# Patient Record
Sex: Female | Born: 1955 | Race: White | Hispanic: No | Smoking: Never smoker
Health system: Southern US, Community
[De-identification: ages and names within clinical notes are randomized; demographics above are authoritative.]

## PROBLEM LIST (undated history)

## (undated) DIAGNOSIS — E785 Hyperlipidemia, unspecified: Secondary | ICD-10-CM

## (undated) DIAGNOSIS — I10 Essential (primary) hypertension: Secondary | ICD-10-CM

## (undated) DIAGNOSIS — M1990 Unspecified osteoarthritis, unspecified site: Secondary | ICD-10-CM

## (undated) DIAGNOSIS — K219 Gastro-esophageal reflux disease without esophagitis: Secondary | ICD-10-CM

## (undated) DIAGNOSIS — J189 Pneumonia, unspecified organism: Secondary | ICD-10-CM

## (undated) HISTORY — PX: CHOLECYSTECTOMY: SHX55

## (undated) HISTORY — PX: APPENDECTOMY: SHX54

## (undated) HISTORY — PX: TUBAL LIGATION: SHX77

---

## 2000-04-23 ENCOUNTER — Encounter: Payer: Self-pay | Admitting: Obstetrics and Gynecology

## 2000-04-23 ENCOUNTER — Encounter: Admission: RE | Admit: 2000-04-23 | Discharge: 2000-04-23 | Payer: Self-pay | Admitting: Obstetrics and Gynecology

## 2003-06-22 ENCOUNTER — Ambulatory Visit (HOSPITAL_COMMUNITY): Admission: RE | Admit: 2003-06-22 | Discharge: 2003-06-22 | Payer: Self-pay | Admitting: Gastroenterology

## 2006-04-21 ENCOUNTER — Other Ambulatory Visit: Admission: RE | Admit: 2006-04-21 | Discharge: 2006-04-21 | Payer: Self-pay | Admitting: Family Medicine

## 2006-05-28 ENCOUNTER — Encounter: Admission: RE | Admit: 2006-05-28 | Discharge: 2006-05-28 | Payer: Self-pay | Admitting: Family Medicine

## 2007-03-04 ENCOUNTER — Encounter (INDEPENDENT_AMBULATORY_CARE_PROVIDER_SITE_OTHER): Payer: Self-pay | Admitting: Specialist

## 2007-03-04 ENCOUNTER — Observation Stay (HOSPITAL_COMMUNITY): Admission: EM | Admit: 2007-03-04 | Discharge: 2007-03-05 | Payer: Self-pay | Admitting: Emergency Medicine

## 2007-03-04 ENCOUNTER — Encounter: Admission: RE | Admit: 2007-03-04 | Discharge: 2007-03-04 | Payer: Self-pay | Admitting: *Deleted

## 2007-04-26 ENCOUNTER — Other Ambulatory Visit: Admission: RE | Admit: 2007-04-26 | Discharge: 2007-04-26 | Payer: Self-pay | Admitting: Family Medicine

## 2008-03-21 IMAGING — CT CT ABDOMEN W/ CM
2 of 5 series · 13 of 32 positions shown, 18 images · IV contrast (READICAT/WATER & [ID] OMNI 300)
Comparison: none

CLINICAL DATA: 50-year-old female right mid and lower abdominal pain for 1 day.  
ABDOMEN CT WITH CONTRAST:
TECHNIQUE: Multidetector CT imaging of the abdomen was performed following the standard protocol during bolus administration of intravenous contrast.
Contrast:  100 cc Omnipaque 300
TECHNIQUE: Multidetector CT imaging of the pelvis was performed following the standard protocol during bolus administration of intravenous contrast.

[Series 2: abdomen w/ · axial · 0.78mm/px · z∈[-400,-95]mm · 5 of 93 slices shown, 10 images]
[im 16/93  soft-tissue]
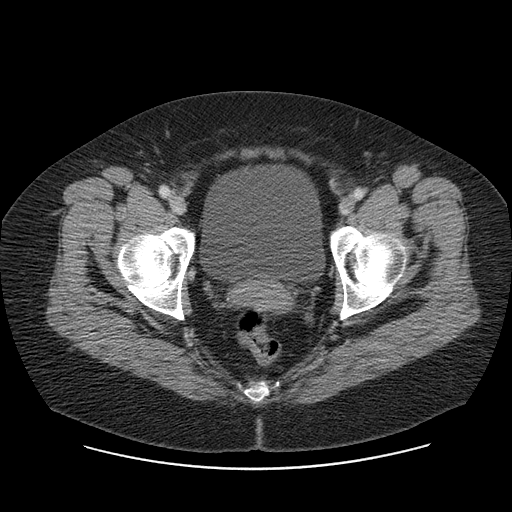
[im 16/93  bone]
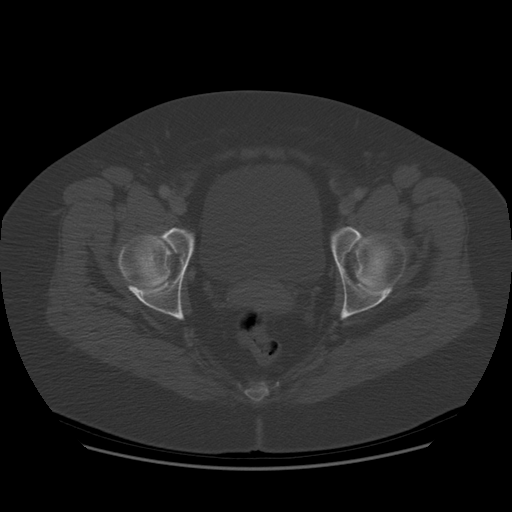
[im 31/93  soft-tissue]
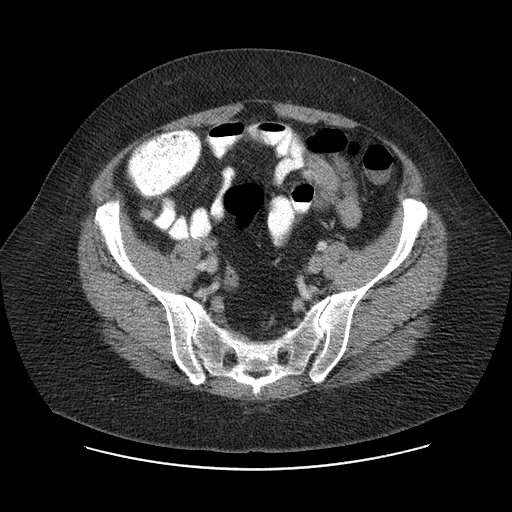
[im 31/93  lung]
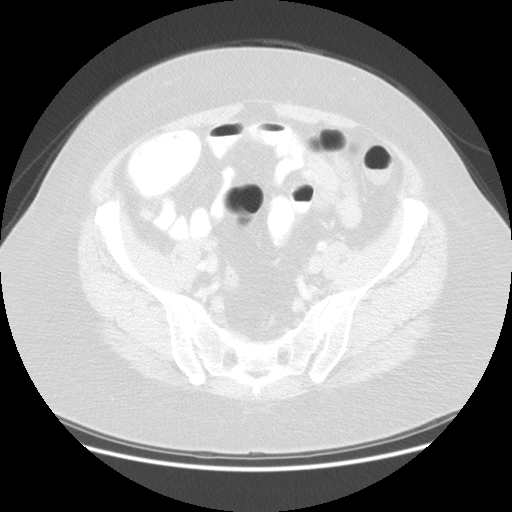
[im 47/93  soft-tissue]
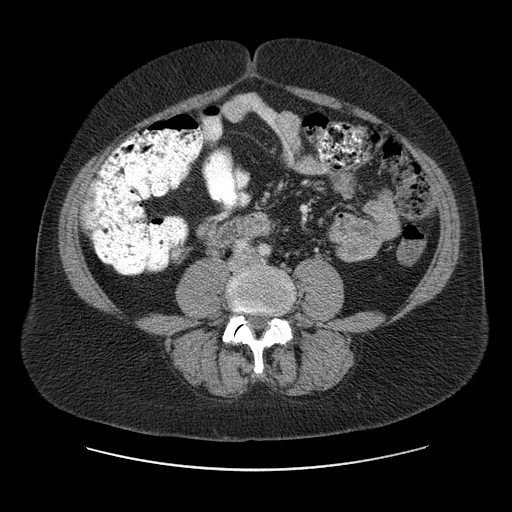
[im 47/93  lung]
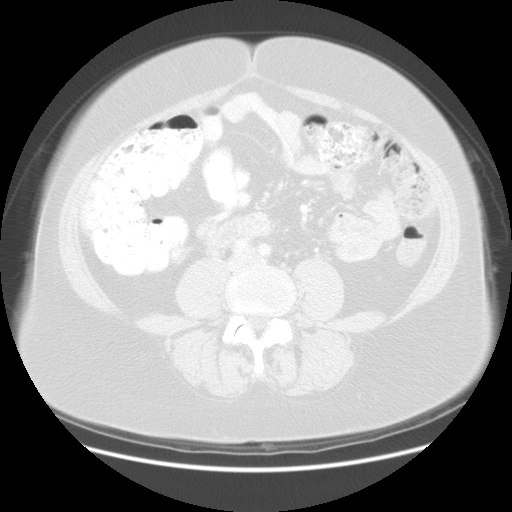
[im 62/93  soft-tissue]
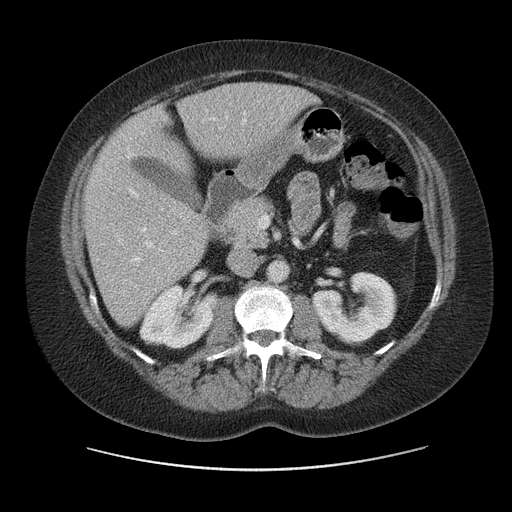
[im 62/93  lung]
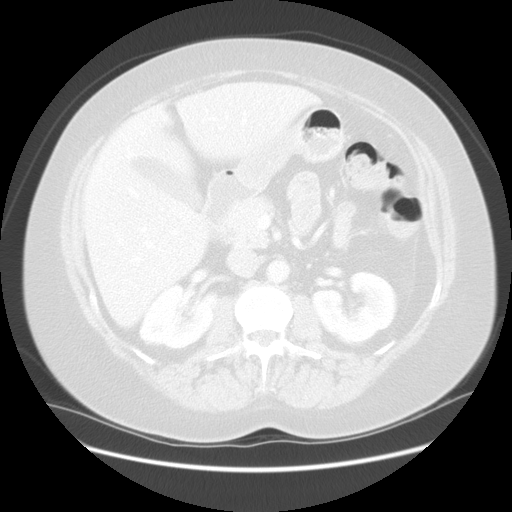
[im 77/93  soft-tissue]
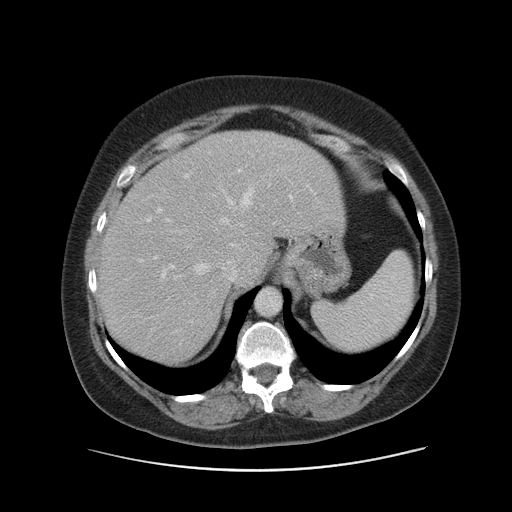
[im 77/93  lung]
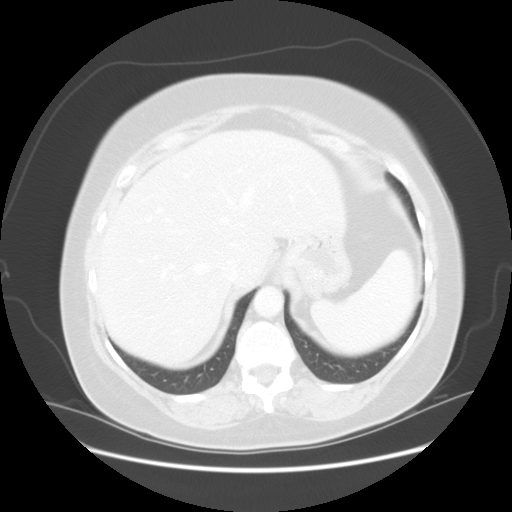

[Series 400: sagittal · sagittal · 0.92mm/px · 8 of 137 slices shown]
[im 14/137  soft-tissue]
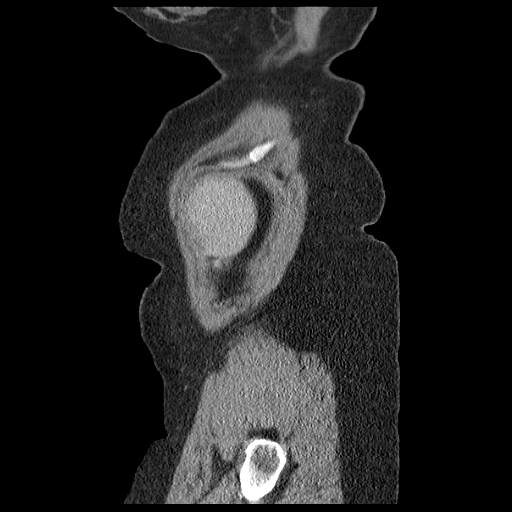
[im 28/137  soft-tissue]
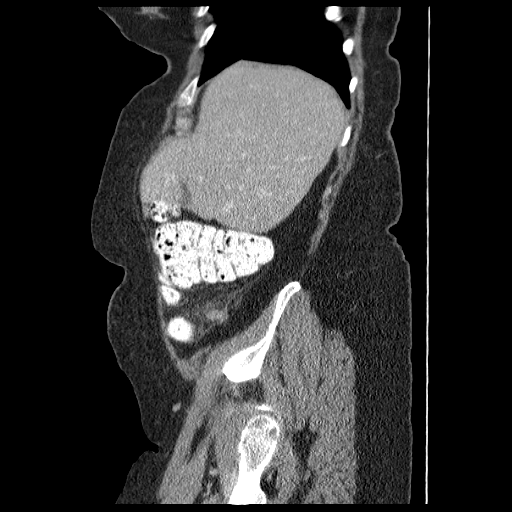
[im 41/137  soft-tissue]
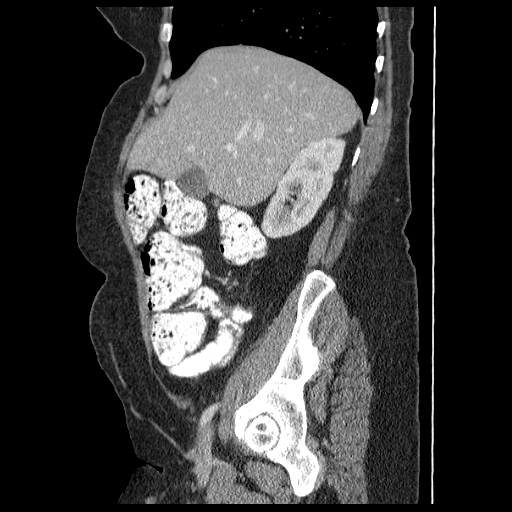
[im 55/137  soft-tissue]
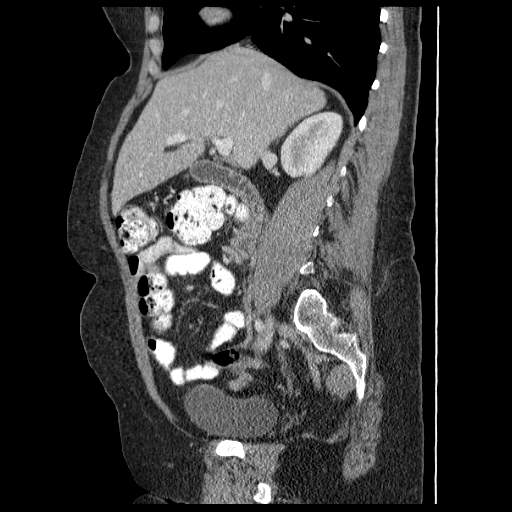
[im 82/137  soft-tissue]
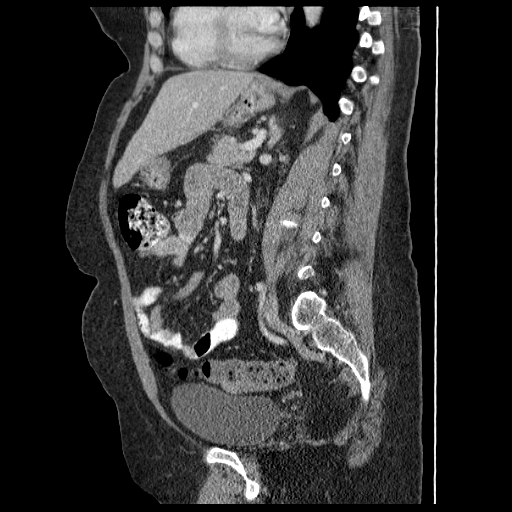
[im 96/137  soft-tissue]
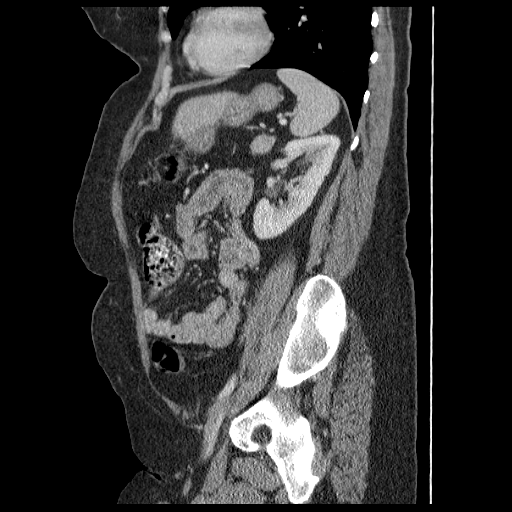
[im 109/137  soft-tissue]
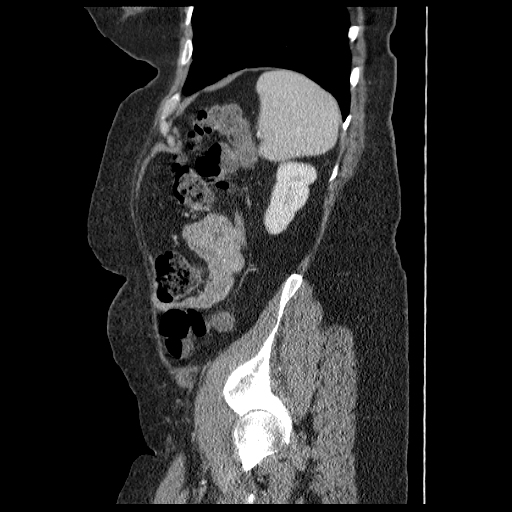
[im 123/137  soft-tissue]
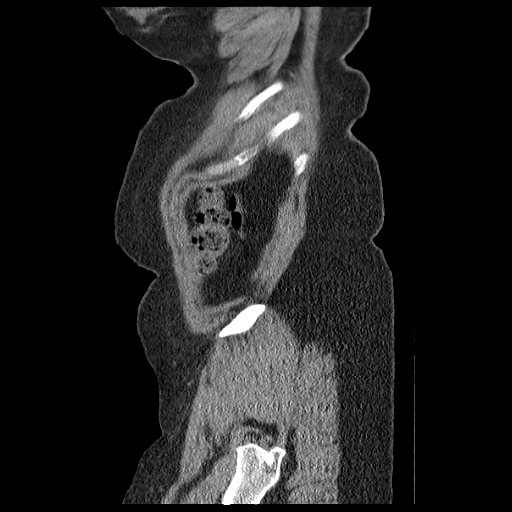

[13 of 32 positions shown; findings below may reference images not displayed]

FINDINGS: Lung bases are clear.  No pericardial or pleural effusion.  Normal heart size.  Degenerative changes of the thoracic spine. 
In the abdomen, the liver, gallbladder, biliary system, adrenal glands, kidneys, spleen, and the pancreas are normal.  No bowel obstruction, abdominal ascites, fluid collection, abscess, or free air.  In the right lower quadrant, there is haziness of the pericecal fat with mildly prominent lymph nodes.  More inferiorly the appendix has wall enhancement and has a diameter of 10 mm.  The findings are suspicious for early acute appendicitis.  No evidence of perforation, abscess, or bowel obstruction.
IMPRESSION: 1.  CT findings suspicious for early acute appendicitis in the right lower quadrant as described.
2.  No evidence of abscess or perforation.  No free air. 
PELVIS CT WITH CONTRAST:
FINDINGS: No pelvic adenopathy, free fluid, fluid collection, or acute bowel abnormality.  Urinary bladder is mildly distended.
IMPRESSION: No acute intrapelvic finding.  No significant free fluid.

## 2008-05-16 ENCOUNTER — Other Ambulatory Visit: Admission: RE | Admit: 2008-05-16 | Discharge: 2008-05-16 | Payer: Self-pay | Admitting: Family Medicine

## 2009-04-24 ENCOUNTER — Encounter: Admission: RE | Admit: 2009-04-24 | Discharge: 2009-04-24 | Payer: Self-pay | Admitting: Family Medicine

## 2010-04-29 ENCOUNTER — Other Ambulatory Visit: Admission: RE | Admit: 2010-04-29 | Discharge: 2010-04-29 | Payer: Self-pay | Admitting: Family Medicine

## 2011-05-15 NOTE — Op Note (Signed)
[name], [name]               ACCOUNT NO.:  [id number]   MEDICAL RECORD NO.:  [id number]          PATIENT TYPE:  INP   LOCATION:  0098                         FACILITY:  [facility]   PHYSICIAN:  Sandria Bales. Ezzard Standing, M.D.  DATE OF BIRTH:  [birth year]   DATE OF PROCEDURE:  DATE OF DISCHARGE:                               OPERATIVE REPORT   PREOPERATIVE DIAGNOSIS:  Appendicitis.   POSTOPERATIVE DIAGNOSIS:  Appendicitis.   PROCEDURE:  Laparoscopic appendectomy.   SURGEON:  Sandria Bales. Ezzard Standing, M.D.   ASSISTANT:  None.   ANESTHESIA:  General endotracheal.   ESTIMATED BLOOD LOSS:  Minimal.   INDICATIONS FOR PROCEDURE:  Ms. [name] is a 55 year old white female  patient of Dr. Sigmund Hazel who developed lower abdominal pain which  localized to the right lower quadrant.  She had a CT scan which showed  evidence of acute appendicitis and saw Dr. Selena Batten, who referred  her to me.  She came to the [facility] emergency room, where I  evaluated her.  I thought her signs and symptoms were consistent with  acute appendicitis.  I discussed with her and her husband about  proceeding with laparoscopic appendectomy.   I discussed the potential complications, to include bleeding, infection,  which I think she already has, bowel resection, and the possibly of open  surgery.   OPERATIVE NOTE:  Patient placed in a supine position.  She is already on  Cefoxitin as an IV antibiotic.  She had a Foley catheter in place.  Her  abdomen was prepped with Betadine solution and sterilely draped.  An  infraumbilical incision was made with sharp dissection and carried down  to the abdominal cavity.  A 12 mm Hasson trocar was secured with a 0  Vicryl suture.  A 10 mm 0 degree laparoscope was inserted through the  Hasson.  Laparoscopic exploration revealed right and left lobes of the  liver unremarkable.  The stomach was unremarkable.  Gynecologically, she  was unremarkable.  She did have 1 or 2 little fibroids  I could see.  Two  additional trocars were placed, a 5 mm [employer] trocar in the  right upper quadrant and a 10 mm [employer] in the left lower  quadrant.   The appendix was then identified lateral to the cecum.  I mobilized the  mesentery of the appendix using a harmonic scalpel.  I got down to the  base of the appendix, then I used an Ethicon 45 mm vascular load of the  Endo-GIA staple to staple across the base of the appendix.  The appendix  was then placed in an EndoCatch bag, delivered through the umbilicus,  and sent to pathology.  I then reinspected the appendiceal stump.  There  was no evidence of any bowel injury, and the staples looked intact.  I  irrigated the abdomen with 500 cc of saline.  There was minimal  contamination, so this was kind of an early appendicitis.   Each trocar was then removed in turn.  There was no bleeding at any  trocar site.  The umbilical port was  closed with a 2-0 Vicryl suture.  Each wound was closed with a 5-0 Monocryl suture, painted with Tincture  of Benzoin and Steri-Strips.  The patient tolerated the procedure well  and was transported to the recovery room in good condition.  Sponge and  needle count were correct at the end of the case.      Sandria Bales. Ezzard Standing, M.D.  Electronically Signed     DHN/MEDQ  D:  03/04/2007  T:  03/04/2007  Job:  [id number]   cc:   Sigmund Hazel, M.D.  Fax: [phone]   Pam Drown, M.D.  Fax: [phone]

## 2011-05-15 NOTE — Op Note (Signed)
   NAMEJILLANN, [name]                           ACCOUNT NO.:  [id number]   MEDICAL RECORD NO.:  [id number]                   PATIENT TYPE:  AMB   LOCATION:  ENDO                                 FACILITY:  [facility]   PHYSICIAN:  Anselmo Rod, M.D.               DATE OF BIRTH:  1956-09-01   DATE OF PROCEDURE:  06/22/2003  DATE OF DISCHARGE:                                 OPERATIVE REPORT   PROCEDURE:  Screening colonoscopy.   ENDOSCOPIST:  Anselmo Rod, M.D.   INSTRUMENT USED:  Olympus video colonoscope.   INDICATIONS FOR PROCEDURE:  Guaiac positive stools in a 55 year old white  female with a history of IBS, rule out colonic polyps, masses, hemorrhoids,  etc.   PREPROCEDURE PREPARATION:  Informed consent was procured from the patient.  The patient was fasted for eight hours prior to the procedure and prepped  with a bottle of magnesium citrate and a gallon of GoLYTELY the night prior  to the procedure.   PREPROCEDURE PHYSICAL EXAMINATION:  VITAL SIGNS: Stable.  NECK:  Supple.  CHEST:  Clear to auscultation. S1 and S2 regular.  ABDOMEN: Soft with normal bowel sounds.   DESCRIPTION OF PROCEDURE:  The patient was placed in the left lateral  decubitus position and sedated with an additional 30 mg of Demerol and 3 mg  of Versed intravenously. Once the patient was adequately sedated and  maintained on low flow oxygen and continuous cardiac monitoring, the Olympus  video colonoscope was advanced into the rectum to the cecum and terminal  ileum.  The appendiceal orifice was visualized and photographed. The patient  had discomfort with insufflation of air into the colon indicating  hypersensitivity.  The terminal ileum appeared healthy and without lesions.  No masses, polyps, erosions, ulcerations, or diverticula were seen.  Retroflexion in the rectum revealed no abnormalities.   IMPRESSION:  Normal colonoscopy up to the terminal ileum except for evidence  of visceral  hypersensitivity with insufflation of air into the colon.    RECOMMENDATIONS:  A high fiber diet with liberal fluid intake has been  advocated. 20 to 25 grams of fiber in the diet has been advised.  Repeat  guaiac testing will be done on an outpatient basis and further  recommendations will be made in the office in the next two weeks.                                               Anselmo Rod, M.D.    JNM/MEDQ  D:  06/22/2003  T:  06/23/2003  Job:  [id number]   cc:   Gabriel Earing, M.D.  [address]  [place]  [place] [address]  Fax: [phone]

## 2011-05-15 NOTE — Op Note (Signed)
   NAMEJOBY, Connie                           ACCOUNT NO.:  [id number]   MEDICAL RECORD NO.:  [id number]                   PATIENT TYPE:  AMB   LOCATION:  ENDO                                 FACILITY:  [facility]   PHYSICIAN:  Anselmo Rod, M.D.               DATE OF BIRTH:  December 14, 1956   DATE OF PROCEDURE:  06/22/2003  DATE OF DISCHARGE:                                 OPERATIVE REPORT   PROCEDURE:  Esophagogastroduodenoscopy.   ENDOSCOPIST:  Anselmo Rod, M.D.   INSTRUMENT USED:  Olympus video panendoscope.   INDICATIONS FOR PROCEDURE:  A 55 year old white female with guaiac positive  stools. Rule out peptic ulcer disease, esophagitis, gastritis, etc. The  patient has a history of reflux with a persistent cough.   PREPROCEDURE PREPARATION:  Informed consent was procured from the patient.  The patient was fasted for eight hours prior to the procedure.   PREPROCEDURE PHYSICAL EXAMINATION:  VITAL SIGNS: Stable.  NECK:  Supple.  CHEST:  Clear to auscultation. S1 and S2 regular.  ABDOMEN: Soft with normal bowel sounds.   DESCRIPTION OF PROCEDURE:  The patient was placed in the left lateral  decubitus position and sedated with 70 mg of Demerol and 7 mg of Versed  intravenously. Once the patient was adequately sedated and maintained on low  flow oxygen and continuous cardiac monitoring, the Olympus video  panendoscope was advanced through the mouthpiece, over the tongue, and into  the esophagus under direct vision. The entire esophagus appeared normal with  no evidence of ring, stricture, masses, esophagitis, or Barrett's mucosa.  The scope was then advanced into the stomach.  The entire gastric mucosa and  the proximal small bowel appeared normal. Retroflexion in the high cardia  revealed no abnormalities.   IMPRESSION:  Normal EGD.   RECOMMENDATIONS:  Proceed with a colonoscopy at this time.                                               Anselmo Rod, M.D.   JNM/MEDQ  D:  06/22/2003  T:  06/23/2003  Job:  [id number]   cc:   Gabriel Earing, M.D.  [address]  [place]  [place] [address]  Fax: [phone]
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
## 2016-01-22 ENCOUNTER — Other Ambulatory Visit: Payer: Self-pay | Admitting: Orthopaedic Surgery

## 2016-01-22 DIAGNOSIS — M25561 Pain in right knee: Secondary | ICD-10-CM

## 2016-01-27 ENCOUNTER — Ambulatory Visit
Admission: RE | Admit: 2016-01-27 | Discharge: 2016-01-27 | Disposition: A | Discharge status: Home / Self Care | Payer: BLUE CROSS/BLUE SHIELD | Source: Ambulatory Visit | Attending: Orthopaedic Surgery | Admitting: Orthopaedic Surgery

## 2016-01-27 DIAGNOSIS — M25561 Pain in right knee: Secondary | ICD-10-CM

## 2017-02-03 ENCOUNTER — Ambulatory Visit (INDEPENDENT_AMBULATORY_CARE_PROVIDER_SITE_OTHER): Payer: BLUE CROSS/BLUE SHIELD | Admitting: Physician Assistant

## 2017-02-03 DIAGNOSIS — M1711 Unilateral primary osteoarthritis, right knee: Secondary | ICD-10-CM | POA: Diagnosis not present

## 2017-02-03 MED ORDER — METHYLPREDNISOLONE ACETATE 40 MG/ML IJ SUSP
40.0000 mg | INTRAMUSCULAR | Status: AC | PRN
  Administered 2017-02-03: 40 mg via INTRA_ARTICULAR

## 2017-02-03 MED ORDER — LIDOCAINE HCL 1 % IJ SOLN
1.0000 mL | INTRAMUSCULAR | Status: AC | PRN
  Administered 2017-02-03: 1 mL

## 2017-02-03 NOTE — Progress Notes (Signed)
   Office Visit Note   Patient: [name]           Date of Birth: 18-Nov-1956           MRN: [id number] Visit Date: 02/03/2017              Requested by: No referring provider defined for this encounter. PCP: No primary care provider on file.   Assessment & Plan: Visit Diagnoses:  1. Primary osteoarthritis of right knee     Plan: She'll work on Dance movement psychotherapistquad strengthening. Follow with us on a when necessary basis.  Follow-Up Instructions: Return if symptoms worsen or fail to improve.   Orders:  Orders Placed This Encounter  Procedures  . Large Joint Injection/Arthrocentesis   No orders of the defined types were placed in this encounter.     Procedures: Large Joint Inj Date/Time: 02/03/2017 1:27 PM Performed by: Kirtland BouchardLARK, Tawny Raspberry W Authorized by: Kirtland BouchardLARK, Derrich Gaby W   Consent Given by:  Patient Indications:  Pain Location:  Knee Site:  R knee Needle Size:  22 G Approach:  Anterolateral Ultrasound Guidance: No   Fluoroscopic Guidance: No   Medications:  1 mL lidocaine 1 %; 40 mg methylPREDNISolone acetate 40 MG/ML Aspiration Attempted: No   Patient tolerance:  Patient tolerated the procedure well with no immediate complications     Clinical Data: No additional findings.   Subjective: No chief complaint on file.   HPI [name] comes in today requesting a right knee injection. Last injection was 05/06/2016. She had no known injury. She did undergo right knee arthroscopy on 03/05/2016 with a partial medial meniscectomy and debridement of medial compartmental R Wright us which was full-thickness.. Review of Systems   Objective: Vital Signs: There were no vitals taken for this visit.  Physical Exam  Constitutional: She is oriented to person, place, and time. She appears well-developed and well-nourished.  Pulmonary/Chest: Effort normal.  Neurological: She is alert and oriented to person, place, and time.    Ortho Exam Range of motion of the right knee. No effusion  no abnormal warmth no erythema. Specialty Comments:  No specialty comments available.  Imaging: No results found.   PMFS History: There are no active problems to display for this patient.  No past medical history on file.  No family history on file.  No past surgical history on file. Social History   Occupational History  . Not on file.   Social History Main Topics  . Smoking status: Not on file  . Smokeless tobacco: Not on file  . Alcohol use Not on file  . Drug use: Unknown  . Sexual activity: Not on file

## 2017-02-25 HISTORY — PX: KNEE ARTHROSCOPY: SUR90

## 2017-05-05 ENCOUNTER — Telehealth (INDEPENDENT_AMBULATORY_CARE_PROVIDER_SITE_OTHER): Payer: Self-pay

## 2017-05-05 NOTE — Telephone Encounter (Signed)
OK to schedule, but she does need an office visit prior so we can re-assess her knee and possibly get new pre-op x-rays for planning.

## 2017-05-05 NOTE — Telephone Encounter (Signed)
Patient left voice mail about scheduling uni knee.  Okay to schedule?  Does she need ROV?  [phone] or [phone]

## 2017-05-19 ENCOUNTER — Ambulatory Visit (INDEPENDENT_AMBULATORY_CARE_PROVIDER_SITE_OTHER): Payer: BLUE CROSS/BLUE SHIELD | Admitting: Orthopaedic Surgery

## 2017-05-19 ENCOUNTER — Encounter (INDEPENDENT_AMBULATORY_CARE_PROVIDER_SITE_OTHER): Payer: Self-pay

## 2017-05-19 ENCOUNTER — Ambulatory Visit (INDEPENDENT_AMBULATORY_CARE_PROVIDER_SITE_OTHER): Payer: Self-pay

## 2017-05-19 DIAGNOSIS — M1711 Unilateral primary osteoarthritis, right knee: Secondary | ICD-10-CM | POA: Diagnosis not present

## 2017-05-19 DIAGNOSIS — G8929 Other chronic pain: Secondary | ICD-10-CM

## 2017-05-19 DIAGNOSIS — M25561 Pain in right knee: Secondary | ICD-10-CM

## 2017-05-19 MED ORDER — LIDOCAINE HCL 1 % IJ SOLN
3.0000 mL | INTRAMUSCULAR | Status: AC | PRN
  Administered 2017-05-19: 3 mL

## 2017-05-19 MED ORDER — METHYLPREDNISOLONE ACETATE 40 MG/ML IJ SUSP
40.0000 mg | INTRAMUSCULAR | Status: AC | PRN
  Administered 2017-05-19: 40 mg via INTRA_ARTICULAR

## 2017-05-19 NOTE — Progress Notes (Signed)
Office Visit Note   Patient: [name]           Date of Birth: 1956/04/04           MRN: [id number] Visit Date: 05/19/2017              Requested by: No referring provider defined for this encounter. PCP: Patient, No Pcp Per   Assessment & Plan: Visit Diagnoses:  1. Chronic pain of right knee   2. Unilateral primary osteoarthritis, right knee     Plan: I was able to drain 50 mL of fluid from the knee and that she tolerated the steroid injection well. I do feel that she is a candidate for partial knee replacement. I showed her knee model with a long and thorough discussion about knee replaced surgery as well as a escutcheon the risk and benefits of this as well as her intraoperative and postoperative course. Work on getting her set up for the surgery. We then see her back in 2 weeks postoperative but no x-rays of be needed.  Follow-Up Instructions: Return for 2 weeks post-op.   Orders:  Orders Placed This Encounter  Procedures  . Large Joint Injection/Arthrocentesis  . XR Knee 1-2 Views Right   No orders of the defined types were placed in this encounter.     Procedures: Large Joint Inj Date/Time: 05/19/2017 2:36 PM Performed by: Kathryne Hitch Authorized by: Kathryne Hitch   Location:  Knee Site:  R knee Ultrasound Guidance: No   Fluoroscopic Guidance: No   Arthrogram: No   Medications:  3 mL lidocaine 1 %; 40 mg methylPREDNISolone acetate 40 MG/ML     Clinical Data: No additional findings.   Subjective: No chief complaint on file. The patient is well-known to me. She has severe medial compartment arthritis of her right knee. We performed an arthroscopic intervention just over a year ago the right knee and found complete loss of cartilage on the medial compartment. He still plexor daily terms of pain. Is now detrimentally affected her activities daily living, her quality of life, and her mobility. She has a large knee effusion that she  like a strain at a place a steroid. We are going to set her up soon for partial knee replacement. She is hoping to get this done soon due to the fact that her husband unfortunately is battling severe pancreatic cancer that is in late stages. She is tried and failed all forms conservative treatment at this point.  HPI  Review of Systems She currently denies any headache, chest pain, shortness of breath, fever, chills, nausea, vomiting.  Objective: Vital Signs: There were no vitals taken for this visit.  Physical Exam He is alert and oriented 3 and in no acute distress Ortho Exam Examine shoulder right knee shows a large effusion. She has a varus malalignment is easily correctable. Her knee is ligamentously stable. There is some mild patellofemoral crepitation. Specialty Comments:  No specialty comments available.  Imaging: Xr Knee 1-2 Views Right  Result Date: 05/19/2017 An AP and lateral of the right knee show mild varus malalignment with severe arthritis of the medial compartment.    PMFS History: Patient Active Problem List   Diagnosis Date Noted  . Unilateral primary osteoarthritis, right knee 05/19/2017  . Chronic pain of right knee 05/19/2017   No past medical history on file.  No family history on file.  No past surgical history on file. Social History   Occupational History  . Not  on file.   Social History Main Topics  . Smoking status: Not on file  . Smokeless tobacco: Not on file  . Alcohol use Not on file  . Drug use: Unknown  . Sexual activity: Not on file

## 2017-06-18 ENCOUNTER — Other Ambulatory Visit (INDEPENDENT_AMBULATORY_CARE_PROVIDER_SITE_OTHER): Payer: Self-pay | Admitting: Physician Assistant

## 2017-06-23 ENCOUNTER — Other Ambulatory Visit (HOSPITAL_COMMUNITY): Payer: Self-pay | Admitting: Emergency Medicine

## 2017-06-23 NOTE — Patient Instructions (Signed)
Connie BreedMargie Walker  06/23/2017   Your procedure is scheduled on: 07-02-17  [facility]  Entrance [place]  elevators to 3rd floor to  Short Stay Center at [phone].   Call this number if you have problems the morning of surgery [phone]    Remember: ONLY 1 PERSON MAY GO WITH YOU TO SHORT STAY TO GET  READY MORNING OF YOUR SURGERY.  Do not eat food or drink liquids :After Midnight.     Take these medicines the morning of surgery with A SIP OF WATER: ranitidine(zantac)                                 You may not have any metal on your body including hair pins and              piercings  Do not wear jewelry, make-up, lotions, powders or perfumes, deodorant             Do not wear nail polish.  Do not shave  48 hours prior to surgery.               Do not bring valuables to the hospital. [place] IS NOT             RESPONSIBLE   FOR VALUABLES.  Contacts, dentures or bridgework may not be worn into surgery.  Leave suitcase in the car. After surgery it may be brought to your room.               Please read over the following fact sheets you were given: _____________________________________________________________________        [facility] Pre[facility] Before surgery, you can play an important role.  Because skin is not sterile, your skin needs to be as free of germs as possible.  You can reduce the number of germs on your skin by washing with CHG (chlorahexidine gluconate) soap before surgery.  CHG is an antiseptic cleaner which kills germs and bonds with the skin to continue killing germs even after washing. Please DO NOT use if you have an allergy to CHG or antibacterial soaps.  If your skin becomes reddened/irritated stop using the CHG and inform your nurse when you arrive at Short Stay. Do not shave (including legs and underarms) for at least 48 hours prior to the first CHG shower.  You may shave your face/neck. Please follow these  instructions carefully:  1.  Shower with CHG Soap the night before surgery and the  morning of Surgery.  2.  If you choose to wash your hair, wash your hair first as usual with your  normal  shampoo.  3.  After you shampoo, rinse your hair and body thoroughly to remove the  shampoo.                           4.  Use CHG as you would any other liquid soap.  You can apply chg directly  to the skin and wash                       Gently with a scrungie or clean washcloth.  5.  Apply the CHG Soap to your body ONLY FROM THE NECK DOWN.   Do not use on  face/ open                           Wound or open sores. Avoid contact with eyes, ears mouth and genitals (private parts).                       Wash face,  Genitals (private parts) with your normal soap.             6.  Wash thoroughly, paying special attention to the area where your surgery  will be performed.  7.  Thoroughly rinse your body with warm water from the neck down.  8.  DO NOT shower/wash with your normal soap after using and rinsing off  the CHG Soap.                9.  Pat yourself dry with a clean towel.            10.  Wear clean pajamas.            11.  Place clean sheets on your bed the night of your first shower and do not  sleep with pets. Day of Surgery : Do not apply any lotions/deodorants the morning of surgery.  Please wear clean clothes to the hospital/surgery center.  FAILURE TO FOLLOW THESE INSTRUCTIONS MAY RESULT IN THE CANCELLATION OF YOUR SURGERY PATIENT SIGNATURE_________________________________  NURSE SIGNATURE__________________________________  ________________________________________________________________________   Adam Phenix  An incentive spirometer is a tool that can help keep your lungs clear and active. This tool measures how well you are filling your lungs with each breath. Taking long deep breaths may help reverse or decrease the chance of developing breathing (pulmonary) problems (especially  infection) following:  A long period of time when you are unable to move or be active. BEFORE THE PROCEDURE   If the spirometer includes an indicator to show your best effort, your nurse or respiratory therapist will set it to a desired goal.  If possible, sit up straight or lean slightly forward. Try not to slouch.  Hold the incentive spirometer in an upright position. INSTRUCTIONS FOR USE  1. Sit on the edge of your bed if possible, or sit up as far as you can in bed or on a chair. 2. Hold the incentive spirometer in an upright position. 3. Breathe out normally. 4. Place the mouthpiece in your mouth and seal your lips tightly around it. 5. Breathe in slowly and as deeply as possible, raising the piston or the ball toward the top of the column. 6. Hold your breath for 3-5 seconds or for as long as possible. Allow the piston or ball to fall to the bottom of the column. 7. Remove the mouthpiece from your mouth and breathe out normally. 8. Rest for a few seconds and repeat Steps 1 through 7 at least 10 times every 1-2 hours when you are awake. Take your time and take a few normal breaths between deep breaths. 9. The spirometer may include an indicator to show your best effort. Use the indicator as a goal to work toward during each repetition. 10. After each set of 10 deep breaths, practice coughing to be sure your lungs are clear. If you have an incision (the cut made at the time of surgery), support your incision when coughing by placing a pillow or rolled up towels firmly against it. Once you are able to get out of bed, walk around indoors  and cough well. You may stop using the incentive spirometer when instructed by your caregiver.  RISKS AND COMPLICATIONS  Take your time so you do not get dizzy or light-headed.  If you are in pain, you may need to take or ask for pain medication before doing incentive spirometry. It is harder to take a deep breath if you are having pain. AFTER  USE  Rest and breathe slowly and easily.  It can be helpful to keep track of a log of your progress. Your caregiver can provide you with a simple table to help with this. If you are using the spirometer at home, follow these instructions: SEEK MEDICAL CARE IF:   You are having difficultly using the spirometer.  You have trouble using the spirometer as often as instructed.  Your pain medication is not giving enough relief while using the spirometer.  You develop fever of 100.5 F (38.1 C) or higher. SEEK IMMEDIATE MEDICAL CARE IF:   You cough up bloody sputum that had not been present before.  You develop fever of 102 F (38.9 C) or greater.  You develop worsening pain at or near the incision site. MAKE SURE YOU:   Understand these instructions.  Will watch your condition.  Will get help right away if you are not doing well or get worse. Document Released: 04/26/2007 Document Revised: 03/07/2012 Document Reviewed: 06/27/2007 [facility] Patient Information 2014 [place], [place].   ________________________________________________________________________

## 2017-06-25 ENCOUNTER — Encounter (HOSPITAL_COMMUNITY)
Admission: RE | Admit: 2017-06-25 | Discharge: 2017-06-25 | Disposition: A | Discharge status: Home / Self Care | Payer: BLUE CROSS/BLUE SHIELD | Source: Ambulatory Visit | Attending: Orthopaedic Surgery | Admitting: Orthopaedic Surgery

## 2017-06-25 ENCOUNTER — Encounter (HOSPITAL_COMMUNITY): Payer: Self-pay

## 2017-06-25 ENCOUNTER — Encounter (INDEPENDENT_AMBULATORY_CARE_PROVIDER_SITE_OTHER): Payer: Self-pay

## 2017-06-25 DIAGNOSIS — M1711 Unilateral primary osteoarthritis, right knee: Secondary | ICD-10-CM | POA: Insufficient documentation

## 2017-06-25 DIAGNOSIS — Z01810 Encounter for preprocedural cardiovascular examination: Secondary | ICD-10-CM | POA: Insufficient documentation

## 2017-06-25 DIAGNOSIS — Z01818 Encounter for other preprocedural examination: Secondary | ICD-10-CM | POA: Diagnosis present

## 2017-06-25 DIAGNOSIS — I10 Essential (primary) hypertension: Secondary | ICD-10-CM | POA: Insufficient documentation

## 2017-06-25 HISTORY — DX: Pneumonia, unspecified organism: J18.9

## 2017-06-25 HISTORY — DX: Unspecified osteoarthritis, unspecified site: M19.90

## 2017-06-25 HISTORY — DX: Essential (primary) hypertension: I10

## 2017-06-25 HISTORY — DX: Hyperlipidemia, unspecified: E78.5

## 2017-06-25 HISTORY — DX: Gastro-esophageal reflux disease without esophagitis: K21.9

## 2017-06-25 LAB — CBC
HCT: 40.4 % (ref 36.0–46.0)
Hemoglobin: 13.8 g/dL (ref 12.0–15.0)
MCH: 31.4 pg (ref 26.0–34.0)
MCHC: 34.2 g/dL (ref 30.0–36.0)
MCV: 92 fL (ref 78.0–100.0)
PLATELETS: 286 10*3/uL (ref 150–400)
RBC: 4.39 MIL/uL (ref 3.87–5.11)
RDW: 12.2 % (ref 11.5–15.5)
WBC: 6.3 10*3/uL (ref 4.0–10.5)

## 2017-06-25 LAB — SURGICAL PCR SCREEN
MRSA, PCR: NEGATIVE
STAPHYLOCOCCUS AUREUS: NEGATIVE

## 2017-06-25 LAB — BASIC METABOLIC PANEL
Anion gap: 9 (ref 5–15)
BUN: 9 mg/dL (ref 6–20)
CALCIUM: 9.7 mg/dL (ref 8.9–10.3)
CO2: 27 mmol/L (ref 22–32)
CREATININE: 0.69 mg/dL (ref 0.44–1.00)
Chloride: 106 mmol/L (ref 101–111)
GFR calc Af Amer: >60 mL/min (ref >60)
GLUCOSE: 98 mg/dL (ref 65–99)
Potassium: 4.7 mmol/L (ref 3.5–5.1)
Sodium: 142 mmol/L (ref 135–145)

## 2017-06-25 NOTE — Progress Notes (Deleted)
   06/25/17 0928  OBSTRUCTIVE SLEEP APNEA  Have you ever been diagnosed with sleep apnea through a sleep study? No  Do you snore loudly (loud enough to be heard through closed doors)?  1  Do you often feel tired, fatigued, or sleepy during the daytime (such as falling asleep during driving or talking to someone)? 1  Has anyone observed you stop breathing during your sleep? 0  Do you have, or are you being treated for high blood pressure? 1  BMI more than 35 kg/m2? 1  Age > 50 (1-yes) 1  Neck circumference greater than:Female 16 inches or larger, Female 17inches or larger? 0  Female Gender (Yes=1) 0  Obstructive Sleep Apnea Score 5

## 2017-07-01 NOTE — H&P (Signed)
TOTAL KNEE ADMISSION H&P  Patient is being admitted for right partial knee arthroplasty.  Subjective:  Chief Complaint:right knee pain.  HPI: [name], 61 y.o. female, has a history of pain and functional disability in the right knee due to arthritis and has failed non-surgical conservative treatments for greater than 12 weeks to includeNSAID's and/or analgesics, corticosteriod injections, viscosupplementation injections, flexibility and strengthening excercises, use of assistive devices, weight reduction as appropriate and activity modification.  Onset of symptoms was gradual, starting 3 years ago with gradually worsening course since that time. The patient noted prior procedures on the knee to include  arthroscopy on the right knee(s).  Patient currently rates pain in the right knee(s) at 10 out of 10 with activity. Patient has night pain, worsening of pain with activity and weight bearing, pain that interferes with activities of daily living, pain with passive range of motion, crepitus and joint swelling.  Patient has evidence of subchondral sclerosis, periarticular osteophytes and joint space narrowing by imaging studies. There is no active infection.  Patient Active Problem List   Diagnosis Date Noted  . Unilateral primary osteoarthritis, right knee 05/19/2017  . Chronic pain of right knee 05/19/2017   Past Medical History:  Diagnosis Date  . Arthritis   . GERD (gastroesophageal reflux disease)   . HLD (hyperlipidemia)   . Hypertension   . Pneumonia    4 years ago     Past Surgical History:  Procedure Laterality Date  . APPENDECTOMY     about 10 years ago   . CHOLECYSTECTOMY     4 years ago   . KNEE ARTHROSCOPY Right 02/2017  . TUBAL LIGATION     38 years ago     No prescriptions prior to admission.   Allergies  Allergen Reactions  . Statins     Short term memory loss    Social History  Substance Use Topics  . Smoking status: Never Smoker  . Smokeless tobacco:  Never Used  . Alcohol use 3.0 oz/week    5 Glasses of wine per week    No family history on file.   Review of Systems  Musculoskeletal: Positive for joint pain.  All other systems reviewed and are negative.   Objective:  Physical Exam  Constitutional: She is oriented to person, place, and time. She appears well-developed and well-nourished.  HENT:  Head: Normocephalic and atraumatic.  Eyes: EOM are normal. Pupils are equal, round, and reactive to light.  Neck: Normal range of motion. Neck supple.  Cardiovascular: Normal rate.   Respiratory: Effort normal and breath sounds normal.  GI: Soft. Bowel sounds are normal.  Musculoskeletal:       Right knee: She exhibits effusion and abnormal alignment. Tenderness found. Medial joint line tenderness noted.  Neurological: She is alert and oriented to person, place, and time.  Skin: Skin is warm and dry.  Psychiatric: She has a normal mood and affect.    Vital signs in last 24 hours:    Labs:   Estimated body mass index is 35.68 kg/m as calculated from the following:   Height as of 06/25/17: 5\' 5"  (1.651 m).   Weight as of 06/25/17: 214 lb 6.4 oz (97.3 kg).   Imaging Review Plain radiographs demonstrate severe degenerative joint disease of the right knee medial compartment. The overall alignment ismild varus. The bone quality appears to be good for age and reported activity level.  Assessment/Plan:  End stage arthritis, right knee medial compartment  The patient history, physical  examination, clinical judgment of the provider and imaging studies are consistent with end stage degenerative joint disease of the right knee(s) and partial knee arthroplasty is deemed medically necessary. The treatment options including medical management, injection therapy arthroscopy and arthroplasty were discussed at length. The risks and benefits of partial knee arthroplasty were presented and reviewed. The risks due to aseptic loosening, infection,  stiffness, patella tracking problems, thromboembolic complications and other imponderables were discussed. The patient acknowledged the explanation, agreed to proceed with the plan and consent was signed. Patient is being admitted for inpatient treatment for surgery, pain control, PT, OT, prophylactic antibiotics, VTE prophylaxis, progressive ambulation and ADL's and discharge planning. The patient is planning to be discharged home with home health services

## 2017-07-02 ENCOUNTER — Encounter (HOSPITAL_COMMUNITY)
Admission: RE | Disposition: A | Discharge status: Home / Self Care | Payer: Self-pay | Source: Ambulatory Visit | Attending: Orthopaedic Surgery

## 2017-07-02 ENCOUNTER — Ambulatory Visit (HOSPITAL_COMMUNITY): Payer: BLUE CROSS/BLUE SHIELD | Admitting: Registered Nurse

## 2017-07-02 ENCOUNTER — Observation Stay (HOSPITAL_COMMUNITY)
Admission: RE | Admit: 2017-07-02 | Discharge: 2017-07-03 | Disposition: A | Discharge status: Home / Self Care | Payer: BLUE CROSS/BLUE SHIELD | Source: Ambulatory Visit | Attending: Orthopaedic Surgery | Admitting: Orthopaedic Surgery

## 2017-07-02 ENCOUNTER — Observation Stay (HOSPITAL_COMMUNITY): Payer: BLUE CROSS/BLUE SHIELD

## 2017-07-02 ENCOUNTER — Encounter (HOSPITAL_COMMUNITY): Payer: Self-pay

## 2017-07-02 DIAGNOSIS — Z7982 Long term (current) use of aspirin: Secondary | ICD-10-CM | POA: Diagnosis not present

## 2017-07-02 DIAGNOSIS — I10 Essential (primary) hypertension: Secondary | ICD-10-CM | POA: Diagnosis not present

## 2017-07-02 DIAGNOSIS — M1711 Unilateral primary osteoarthritis, right knee: Principal | ICD-10-CM | POA: Insufficient documentation

## 2017-07-02 DIAGNOSIS — K219 Gastro-esophageal reflux disease without esophagitis: Secondary | ICD-10-CM | POA: Diagnosis not present

## 2017-07-02 DIAGNOSIS — E785 Hyperlipidemia, unspecified: Secondary | ICD-10-CM | POA: Diagnosis not present

## 2017-07-02 DIAGNOSIS — Z96651 Presence of right artificial knee joint: Secondary | ICD-10-CM

## 2017-07-02 HISTORY — PX: PARTIAL KNEE ARTHROPLASTY: SHX2174

## 2017-07-02 SURGERY — ARTHROPLASTY, KNEE, UNICOMPARTMENTAL
Anesthesia: Spinal | Site: Knee | Laterality: Right

## 2017-07-02 MED ORDER — LOSARTAN POTASSIUM 50 MG PO TABS
50.0000 mg | ORAL_TABLET | Freq: Every day | ORAL | Status: DC
  Administered 2017-07-02: 13:00:00 50 mg via ORAL
  Filled 2017-07-02 (×2): qty 1

## 2017-07-02 MED ORDER — ACETAMINOPHEN 325 MG PO TABS
650.0000 mg | ORAL_TABLET | Freq: Four times a day (QID) | ORAL | Status: DC | PRN
  Administered 2017-07-02 – 2017-07-03 (×2): 650 mg via ORAL
  Filled 2017-07-02 (×2): qty 2

## 2017-07-02 MED ORDER — CEFAZOLIN SODIUM-DEXTROSE 1-4 GM/50ML-% IV SOLN
1.0000 g | Freq: Four times a day (QID) | INTRAVENOUS | Status: AC
  Administered 2017-07-02 (×2): 1 g via INTRAVENOUS
  Filled 2017-07-02 (×2): qty 50

## 2017-07-02 MED ORDER — ASPIRIN EC 325 MG PO TBEC
325.0000 mg | DELAYED_RELEASE_TABLET | Freq: Two times a day (BID) | ORAL | Status: DC
  Administered 2017-07-03: 325 mg via ORAL
  Filled 2017-07-02: qty 1

## 2017-07-02 MED ORDER — HYDROCHLOROTHIAZIDE 12.5 MG PO CAPS
12.5000 mg | ORAL_CAPSULE | Freq: Every day | ORAL | Status: DC
  Administered 2017-07-02: 13:00:00 12.5 mg via ORAL
  Filled 2017-07-02 (×2): qty 1

## 2017-07-02 MED ORDER — ACETAMINOPHEN 10 MG/ML IV SOLN
INTRAVENOUS | Status: AC
  Filled 2017-07-02: qty 100

## 2017-07-02 MED ORDER — PHENOL 1.4 % MT LIQD: 1.0000 | OROMUCOSAL | Status: DC | PRN

## 2017-07-02 MED ORDER — ONDANSETRON HCL 4 MG/2ML IJ SOLN
INTRAMUSCULAR | Status: AC
  Filled 2017-07-02: qty 2

## 2017-07-02 MED ORDER — PROPOFOL 10 MG/ML IV BOLUS
INTRAVENOUS | Status: AC
  Filled 2017-07-02: qty 20

## 2017-07-02 MED ORDER — ACETAMINOPHEN 10 MG/ML IV SOLN
1000.0000 mg | Freq: Once | INTRAVENOUS | Status: AC
  Administered 2017-07-02: 1000 mg via INTRAVENOUS

## 2017-07-02 MED ORDER — ONDANSETRON HCL 4 MG PO TABS: 4.0000 mg | ORAL_TABLET | Freq: Four times a day (QID) | ORAL | Status: DC | PRN

## 2017-07-02 MED ORDER — DIPHENHYDRAMINE HCL 12.5 MG/5ML PO ELIX: 12.5000 mg | ORAL_SOLUTION | ORAL | Status: DC | PRN

## 2017-07-02 MED ORDER — MIDAZOLAM HCL 2 MG/2ML IJ SOLN
INTRAMUSCULAR | Status: AC
  Filled 2017-07-02: qty 2

## 2017-07-02 MED ORDER — METHOCARBAMOL 500 MG PO TABS
500.0000 mg | ORAL_TABLET | Freq: Four times a day (QID) | ORAL | Status: DC | PRN
  Administered 2017-07-02 – 2017-07-03 (×3): 500 mg via ORAL
  Filled 2017-07-02 (×3): qty 1

## 2017-07-02 MED ORDER — ZOLPIDEM TARTRATE 5 MG PO TABS: 5.0000 mg | ORAL_TABLET | Freq: Every evening | ORAL | Status: DC | PRN

## 2017-07-02 MED ORDER — ALUM & MAG HYDROXIDE-SIMETH 200-200-20 MG/5ML PO SUSP: 30.0000 mL | ORAL | Status: DC | PRN

## 2017-07-02 MED ORDER — FAMOTIDINE 20 MG PO TABS
20.0000 mg | ORAL_TABLET | Freq: Every day | ORAL | Status: DC
  Administered 2017-07-02 – 2017-07-03 (×2): 20 mg via ORAL
  Filled 2017-07-02 (×2): qty 1

## 2017-07-02 MED ORDER — METOCLOPRAMIDE HCL 5 MG/ML IJ SOLN: 5.0000 mg | Freq: Three times a day (TID) | INTRAMUSCULAR | Status: DC | PRN

## 2017-07-02 MED ORDER — MENTHOL 3 MG MT LOZG: 1.0000 | LOZENGE | OROMUCOSAL | Status: DC | PRN

## 2017-07-02 MED ORDER — TRANEXAMIC ACID 1000 MG/10ML IV SOLN
1000.0000 mg | INTRAVENOUS | Status: AC
  Administered 2017-07-02: 1000 mg via INTRAVENOUS
  Filled 2017-07-02: qty 1100

## 2017-07-02 MED ORDER — DOCUSATE SODIUM 100 MG PO CAPS
100.0000 mg | ORAL_CAPSULE | Freq: Two times a day (BID) | ORAL | Status: DC
  Administered 2017-07-02 – 2017-07-03 (×2): 100 mg via ORAL
  Filled 2017-07-02 (×2): qty 1

## 2017-07-02 MED ORDER — PHENYLEPHRINE HCL 10 MG/ML IJ SOLN
INTRAMUSCULAR | Status: DC | PRN
  Administered 2017-07-02: 35 ug/min via INTRAVENOUS

## 2017-07-02 MED ORDER — LACTATED RINGERS IV SOLN
INTRAVENOUS | Status: DC
  Administered 2017-07-02: 1000 mL via INTRAVENOUS

## 2017-07-02 MED ORDER — PROMETHAZINE HCL 25 MG/ML IJ SOLN: 6.2500 mg | INTRAMUSCULAR | Status: DC | PRN

## 2017-07-02 MED ORDER — BUPIVACAINE IN DEXTROSE 0.75-8.25 % IT SOLN
INTRATHECAL | Status: DC | PRN
  Administered 2017-07-02: 2 mL via INTRATHECAL

## 2017-07-02 MED ORDER — LIP MEDEX EX OINT
TOPICAL_OINTMENT | CUTANEOUS | Status: AC
  Filled 2017-07-02: qty 7

## 2017-07-02 MED ORDER — ONDANSETRON HCL 4 MG/2ML IJ SOLN
INTRAMUSCULAR | Status: DC | PRN
  Administered 2017-07-02: 4 mg via INTRAVENOUS

## 2017-07-02 MED ORDER — ROPIVACAINE HCL 5 MG/ML IJ SOLN
INTRAMUSCULAR | Status: DC | PRN
  Administered 2017-07-02: 30 mL via PERINEURAL

## 2017-07-02 MED ORDER — METHOCARBAMOL 1000 MG/10ML IJ SOLN
500.0000 mg | Freq: Four times a day (QID) | INTRAVENOUS | Status: DC | PRN
  Administered 2017-07-02: 500 mg via INTRAVENOUS
  Filled 2017-07-02: qty 550

## 2017-07-02 MED ORDER — DEXAMETHASONE SODIUM PHOSPHATE 10 MG/ML IJ SOLN
INTRAMUSCULAR | Status: AC
  Filled 2017-07-02: qty 1

## 2017-07-02 MED ORDER — CEFAZOLIN SODIUM-DEXTROSE 2-4 GM/100ML-% IV SOLN
2.0000 g | INTRAVENOUS | Status: AC
  Administered 2017-07-02: 2 g via INTRAVENOUS
  Filled 2017-07-02: qty 100

## 2017-07-02 MED ORDER — ACETAMINOPHEN 650 MG RE SUPP: 650.0000 mg | Freq: Four times a day (QID) | RECTAL | Status: DC | PRN

## 2017-07-02 MED ORDER — DEXAMETHASONE SODIUM PHOSPHATE 10 MG/ML IJ SOLN
INTRAMUSCULAR | Status: DC | PRN
  Administered 2017-07-02: 10 mg via INTRAVENOUS

## 2017-07-02 MED ORDER — CHLORHEXIDINE GLUCONATE 4 % EX LIQD: 60.0000 mL | Freq: Once | CUTANEOUS | Status: DC

## 2017-07-02 MED ORDER — FENTANYL CITRATE (PF) 100 MCG/2ML IJ SOLN
INTRAMUSCULAR | Status: DC | PRN
  Administered 2017-07-02: 100 ug via INTRAVENOUS

## 2017-07-02 MED ORDER — MIDAZOLAM HCL 5 MG/5ML IJ SOLN
INTRAMUSCULAR | Status: DC | PRN
  Administered 2017-07-02: 2 mg via INTRAVENOUS
  Administered 2017-07-02 (×2): 1 mg via INTRAVENOUS

## 2017-07-02 MED ORDER — PROPOFOL 10 MG/ML IV BOLUS
INTRAVENOUS | Status: AC
  Filled 2017-07-02: qty 60

## 2017-07-02 MED ORDER — OXYCODONE HCL 5 MG PO TABS
5.0000 mg | ORAL_TABLET | ORAL | Status: DC | PRN
  Administered 2017-07-02: 5 mg via ORAL
  Administered 2017-07-02: 15:00:00 10 mg via ORAL
  Administered 2017-07-02: 12:00:00 5 mg via ORAL
  Administered 2017-07-02 – 2017-07-03 (×6): 10 mg via ORAL
  Filled 2017-07-02 (×2): qty 2
  Filled 2017-07-02: qty 1
  Filled 2017-07-02 (×4): qty 2
  Filled 2017-07-02: qty 1
  Filled 2017-07-02: qty 2

## 2017-07-02 MED ORDER — PROPOFOL 500 MG/50ML IV EMUL
INTRAVENOUS | Status: DC | PRN
  Administered 2017-07-02: 100 ug/kg/min via INTRAVENOUS

## 2017-07-02 MED ORDER — ONDANSETRON HCL 4 MG/2ML IJ SOLN: 4.0000 mg | Freq: Four times a day (QID) | INTRAMUSCULAR | Status: DC | PRN

## 2017-07-02 MED ORDER — LOSARTAN POTASSIUM-HCTZ 50-12.5 MG PO TABS: 1.0000 | ORAL_TABLET | Freq: Every day | ORAL | Status: DC

## 2017-07-02 MED ORDER — METOCLOPRAMIDE HCL 5 MG PO TABS: 5.0000 mg | ORAL_TABLET | Freq: Three times a day (TID) | ORAL | Status: DC | PRN

## 2017-07-02 MED ORDER — HYDROMORPHONE HCL-NACL 0.5-0.9 MG/ML-% IV SOSY: 1.0000 mg | PREFILLED_SYRINGE | INTRAVENOUS | Status: DC | PRN

## 2017-07-02 MED ORDER — SODIUM CHLORIDE 0.9 % IV SOLN
INTRAVENOUS | Status: DC
  Administered 2017-07-02: 12:00:00 75 mL/h via INTRAVENOUS
  Administered 2017-07-03: 01:00:00 via INTRAVENOUS

## 2017-07-02 MED ORDER — HYDROMORPHONE HCL-NACL 0.5-0.9 MG/ML-% IV SOSY: 0.2500 mg | PREFILLED_SYRINGE | INTRAVENOUS | Status: DC | PRN

## 2017-07-02 MED ORDER — PROPOFOL 10 MG/ML IV BOLUS
INTRAVENOUS | Status: DC | PRN
  Administered 2017-07-02: 20 mg via INTRAVENOUS

## 2017-07-02 MED ORDER — FENTANYL CITRATE (PF) 100 MCG/2ML IJ SOLN
INTRAMUSCULAR | Status: AC
  Filled 2017-07-02: qty 2

## 2017-07-02 MED ORDER — LACTATED RINGERS IV SOLN
INTRAVENOUS | Status: DC | PRN
  Administered 2017-07-02 (×3): via INTRAVENOUS

## 2017-07-02 SURGICAL SUPPLY — 45 items
APL SKNCLS STERI-STRIP NONHPOA (GAUZE/BANDAGES/DRESSINGS) ×1
BAG SPEC THK2 15X12 ZIP CLS (MISCELLANEOUS) ×1
BAG ZIPLOCK 12X15 (MISCELLANEOUS) ×2 IMPLANT
BANDAGE ACE 6X5 VEL STRL LF (GAUZE/BANDAGES/DRESSINGS) ×3 IMPLANT
BENZOIN TINCTURE PRP APPL 2/3 (GAUZE/BANDAGES/DRESSINGS) ×2 IMPLANT
BLADE SAW RECIPROCATING 77.5 (BLADE) ×3 IMPLANT
BLADE SAW SAG 13X.89X90 PERFRM (BLADE) ×3 IMPLANT
BLADE SAW STABLE CUT 109 (BLADE) ×2 IMPLANT
BONE CEMENT PALACOSE (Cement) ×3 IMPLANT
BOWL SMART MIX CTS (DISPOSABLE) ×3 IMPLANT
CAPT KNEE PARTIAL 2 ×2 IMPLANT
CEMENT BONE PALACOSE (Cement) IMPLANT
CLOSURE WOUND 1/2 X4 (GAUZE/BANDAGES/DRESSINGS) ×1
CLOTH BEACON ORANGE TIMEOUT ST (SAFETY) ×3 IMPLANT
COVER SURGICAL LIGHT HANDLE (MISCELLANEOUS) ×3 IMPLANT
CUFF TOURN SGL QUICK 34 (TOURNIQUET CUFF) ×3
CUFF TRNQT CYL 34X4X40X1 (TOURNIQUET CUFF) ×1 IMPLANT
DRAPE U-SHAPE 47X51 STRL (DRAPES) ×3 IMPLANT
DRSG AQUACEL AG ADV 3.5X10 (GAUZE/BANDAGES/DRESSINGS) IMPLANT
DRSG PAD ABDOMINAL 8X10 ST (GAUZE/BANDAGES/DRESSINGS) ×4 IMPLANT
DURAPREP 26ML APPLICATOR (WOUND CARE) ×3 IMPLANT
ELECT REM PT RETURN 15FT ADLT (MISCELLANEOUS) ×3 IMPLANT
GAUZE SPONGE 4X4 12PLY STRL (GAUZE/BANDAGES/DRESSINGS) ×3 IMPLANT
GAUZE XEROFORM 1X8 LF (GAUZE/BANDAGES/DRESSINGS) ×2 IMPLANT
GLOVE BIO SURGEON STRL SZ7.5 (GLOVE) ×3 IMPLANT
GLOVE BIOGEL PI IND STRL 8 (GLOVE) ×2 IMPLANT
GLOVE BIOGEL PI INDICATOR 8 (GLOVE) ×4
GLOVE ECLIPSE 8.0 STRL XLNG CF (GLOVE) ×3 IMPLANT
GOWN STRL REUS W/TWL XL LVL3 (GOWN DISPOSABLE) ×6 IMPLANT
HANDPIECE INTERPULSE COAX TIP (DISPOSABLE) ×3
LEGGING LITHOTOMY PAIR STRL (DRAPES) ×3 IMPLANT
MANIFOLD NEPTUNE II (INSTRUMENTS) ×3 IMPLANT
PACK TOTAL KNEE CUSTOM (KITS) ×3 IMPLANT
PADDING CAST COTTON 6X4 STRL (CAST SUPPLIES) ×3 IMPLANT
SET HNDPC FAN SPRY TIP SCT (DISPOSABLE) ×1 IMPLANT
STAPLER VISISTAT 35W (STAPLE) IMPLANT
STRIP CLOSURE SKIN 1/2X4 (GAUZE/BANDAGES/DRESSINGS) ×1 IMPLANT
SUT MNCRL AB 4-0 PS2 18 (SUTURE) IMPLANT
SUT VIC AB 0 CT1 36 (SUTURE) IMPLANT
SUT VIC AB 1 CT1 36 (SUTURE) ×3 IMPLANT
SUT VIC AB 2-0 CT1 27 (SUTURE) ×6
SUT VIC AB 2-0 CT1 TAPERPNT 27 (SUTURE) ×2 IMPLANT
TRAY FOLEY CATH 14FRSI W/METER (CATHETERS) ×2 IMPLANT
TRAY FOLEY W/METER SILVER 16FR (SET/KITS/TRAYS/PACK) ×1 IMPLANT
WRAP KNEE MAXI GEL POST OP (GAUZE/BANDAGES/DRESSINGS) ×3 IMPLANT

## 2017-07-02 NOTE — Anesthesia Postprocedure Evaluation (Signed)
Anesthesia Post Note  Patient: [name]  Procedure(s) Performed: Procedure(s) (LRB): RIGHT UNICOMPARTMENTAL KNEE ARTHROPLASTY (Right)     Patient location during evaluation: PACU Anesthesia Type: Spinal Level of consciousness: oriented and awake and alert Pain management: pain level controlled Vital Signs Assessment: post-procedure vital signs reviewed and stable Respiratory status: spontaneous breathing, respiratory function stable and patient connected to nasal cannula oxygen Cardiovascular status: blood pressure returned to baseline and stable Postop Assessment: no headache and no backache Anesthetic complications: no    Last Vitals:  Vitals:   07/02/17 1306 07/02/17 1411  BP: 132/66 137/74  Pulse: 72 72  Resp: 14 15  Temp: 36.7 C     Last Pain:  Vitals:   07/02/17 1435  TempSrc:   PainSc: 6                  Jaylan Duggar S

## 2017-07-02 NOTE — Anesthesia Procedure Notes (Signed)
Anesthesia Procedure Image    

## 2017-07-02 NOTE — Anesthesia Procedure Notes (Signed)
Anesthesia Regional Block: Adductor canal block   Pre-Anesthetic Checklist: ,, timeout performed, Correct Patient, Correct Site, Correct Laterality, Correct Procedure, Correct Position, site marked, Risks and benefits discussed,  Surgical consent,  Pre-op evaluation,  At surgeon's request and post-op pain management  Laterality: Right  Prep: chloraprep       Needles:  Injection technique: Single-shot  Needle Type: Echogenic Needle     Needle Length: 9cm      Additional Needles:   Procedures: ultrasound guided,,,,,,,,  Narrative:  Start time: 07/02/2017 7:02 AM End time: 07/02/2017 7:12 AM Injection made incrementally with aspirations every 5 mL.  Performed by: Personally  Anesthesiologist: Clarkson Rosselli  Additional Notes: Patient tolerated the procedure well without complications

## 2017-07-02 NOTE — Transfer of Care (Signed)
Immediate Anesthesia Transfer of Care Note  Patient: [name]  Procedure(s) Performed: Procedure(s) with comments: RIGHT UNICOMPARTMENTAL KNEE ARTHROPLASTY (Right) - with block  Patient Location: PACU  Anesthesia Type:Spinal  Level of Consciousness: awake, alert , oriented and patient cooperative  Airway & Oxygen Therapy: Patient Spontanous Breathing and Patient connected to face mask oxygen  Post-op Assessment: Report given to RN and Post -op Vital signs reviewed and stable  Post vital signs: stable  Last Vitals:  Vitals:   07/02/17 0714 07/02/17 0716  BP:    Pulse: 63 64  Resp: 17 (!) 9  Temp:      Last Pain:  Vitals:   07/02/17 0523  TempSrc: Oral      Patients Stated Pain Goal: 4 (07/02/17 0550)  Complications: No apparent anesthesia complications

## 2017-07-02 NOTE — H&P (Signed)
I have seen and examined the patient this am.  She understands fully that we will proceed to surgery this morning for a right partial knee replacement to treat her right knee medial compartment arthritis.  Risks and benefits discussed.  Informed consent obtained.

## 2017-07-02 NOTE — Brief Op Note (Signed)
07/02/2017  9:05 AM  PATIENT:  [name]  61 y.o. female  PRE-OPERATIVE DIAGNOSIS:  osteoarthritis right knee  POST-OPERATIVE DIAGNOSIS:  osteoarthritis right knee  PROCEDURE:  Procedure(s) with comments: RIGHT UNICOMPARTMENTAL KNEE ARTHROPLASTY (Right) - with block  SURGEON:  Surgeon(s) and Role:    * Kathryne HitchBlackman, Cambre Matson Y, MD - Primary  PHYSICIAN ASSISTANT: Rexene EdisonGil Clark, PA-C  ANESTHESIA:   regional and spinal  EBL:  Total I/O In: 1000 [I.V.:1000] Out: -   COUNTS:  YES  TOURNIQUET:   Total Tourniquet Time Documented: Thigh (Right) - 55 minutes Total: Thigh (Right) - 55 minutes   DICTATION: .Other Dictation: Dictation Number [id number]  PLAN OF CARE: Admit for overnight observation  PATIENT DISPOSITION:  PACU - hemodynamically stable.   Delay start of Pharmacological VTE agent (>24hrs) due to surgical blood loss or risk of bleeding: no

## 2017-07-02 NOTE — Anesthesia Procedure Notes (Signed)
Spinal  Patient location during procedure: OR Start time: 07/02/2017 7:45 AM End time: 07/02/2017 7:45 AM Staffing Anesthesiologist: Stehanie Ekstrom, Iona Beard Performed: anesthesiologist  Preanesthetic Checklist Completed: patient identified, site marked, surgical consent, pre-op evaluation, timeout performed, IV checked, risks and benefits discussed and monitors and equipment checked Spinal Block Patient position: sitting Prep: Betadine Patient monitoring: heart rate, continuous pulse ox and blood pressure Injection technique: single-shot Needle Needle type: Spinocan  Needle gauge: 22 G Needle length: 9 cm Assessment Events: paresthesia Additional Notes Expiration date of kit checked and confirmed. Patient tolerated procedure well, without complications.  CRNA attempt x 3  Paresthesia on L with positive CSF, transient in nature

## 2017-07-02 NOTE — Anesthesia Preprocedure Evaluation (Signed)
Anesthesia Evaluation  Patient identified by MRN, date of birth, ID band Patient awake    Reviewed: Allergy & Precautions, NPO status , Patient's Chart, lab work & pertinent test results  Airway Mallampati: II  TM Distance: >3 FB Neck ROM: Full    Dental no notable dental hx.    Pulmonary neg pulmonary ROS,    Pulmonary exam normal breath sounds clear to auscultation       Cardiovascular hypertension, Normal cardiovascular exam Rhythm:Regular Rate:Normal     Neuro/Psych negative neurological ROS  negative psych ROS   GI/Hepatic negative GI ROS, Neg liver ROS,   Endo/Other  negative endocrine ROS  Renal/GU negative Renal ROS  negative genitourinary   Musculoskeletal negative musculoskeletal ROS (+)   Abdominal   Peds negative pediatric ROS (+)  Hematology negative hematology ROS (+)   Anesthesia Other Findings   Reproductive/Obstetrics negative OB ROS                             Anesthesia Physical Anesthesia Plan  ASA: II  Anesthesia Plan: Spinal   Post-op Pain Management:  Regional for Post-op pain   Induction: Intravenous  PONV Risk Score and Plan:   Airway Management Planned: Simple Face Mask  Additional Equipment:   Intra-op Plan:   Post-operative Plan:   Informed Consent: I have reviewed the patients History and Physical, chart, labs and discussed the procedure including the risks, benefits and alternatives for the proposed anesthesia with the patient or authorized representative who has indicated his/her understanding and acceptance.   Dental advisory given  Plan Discussed with: CRNA and Surgeon  Anesthesia Plan Comments:         Anesthesia Quick Evaluation

## 2017-07-02 NOTE — Progress Notes (Signed)
Assisted Dr. Rose with right, ultrasound guided, adductor canal block. Side rails up, monitors on throughout procedure. See vital signs in flow sheet. Tolerated Procedure well.  

## 2017-07-02 NOTE — Evaluation (Signed)
Physical Therapy Evaluation Patient Details Name: Connie BreedMargie Walker MRN: [id number] DOB: 30-Jun-1956 Today's Date: 07/02/2017   History of Present Illness  Pt s/p R UKR  Clinical Impression  Pt s/p R UKR and presents with decreased R LE strength/ROM and post op pain limiting functional mobility.  Pt should progress to dc home with family assist.    Follow Up Recommendations Home health PT    Equipment Recommendations  None recommended by PT    Recommendations for Other Services OT consult     Precautions / Restrictions Precautions Precautions: Fall;Knee Restrictions Weight Bearing Restrictions: No Other Position/Activity Restrictions: WBAT      Mobility  Bed Mobility Overal bed mobility: Needs Assistance Bed Mobility: Supine to Sit     Supine to sit: Min assist     General bed mobility comments: cues for sequence and min assist to manage R LE  Transfers Overall transfer level: Needs assistance Equipment used: Rolling Walker (2 wheeled) Transfers: Sit to/from Stand Sit to Stand: Min assist         General transfer comment: cues for LE management and use of UEs to self assist  Ambulation/Gait Ambulation/Gait assistance: Min assist Ambulation Distance (Feet): 50 Feet Assistive device: Rolling Walker (2 wheeled) Gait Pattern/deviations: Step-to pattern;Decreased step length - right;Decreased step length - left;Shuffle;Trunk flexed Gait velocity: decr Gait velocity interpretation: Below normal speed for age/gender General Gait Details: cues for sequence, posture and position from [employer]  Stairs            Wheelchair Mobility    Modified Rankin (Stroke Patients Only)       Balance                                             Pertinent Vitals/Pain Pain Assessment: 0-10 Pain Score: 3  Pain Location: R knee Pain Descriptors / Indicators: Aching;Sore Pain Intervention(s): Limited activity within patient's tolerance;Monitored during  session;Premedicated before session;Ice applied    Home Living Family/patient expects to be discharged to:: Private residence Living Arrangements: Spouse/significant other Available Help at Discharge: Family Type of Home: House Home Access: Stairs to enter Entrance Stairs-Rails: Doctor, general practiceight;Left Entrance Stairs-Number of Steps: 4 Home Layout: Two level;Able to live on main level with bedroom/bathroom Home Equipment: Dan HumphreysWalker - 2 wheels;Cane - single point      Prior Function Level of Independence: Independent               Hand Dominance        Extremity/Trunk Assessment   Upper Extremity Assessment Upper Extremity Assessment: Overall WFL for tasks assessed    Lower Extremity Assessment Lower Extremity Assessment: RLE deficits/detail    Cervical / Trunk Assessment Cervical / Trunk Assessment: Normal  Communication   Communication: No difficulties  Cognition Arousal/Alertness: Awake/alert Behavior During Therapy: WFL for tasks assessed/performed Overall Cognitive Status: Within Functional Limits for tasks assessed                                        General Comments      Exercises Total Joint Exercises Ankle Circles/Pumps: AROM;Both;15 reps;Supine   Assessment/Plan    PT Assessment Patient needs continued PT services  PT Problem List Decreased strength;Decreased range of motion;Decreased activity tolerance;Decreased mobility;Decreased knowledge of use of DME;Pain  PT Treatment Interventions DME instruction;Gait training;Stair training;Functional mobility training;Therapeutic activities;Therapeutic exercise;Patient/family education    PT Goals (Current goals can be found in the Care Plan section)  Acute Rehab PT Goals Patient Stated Goal: Regain IND ASAP to resume care giving for husband with stage !V Ca PT Goal Formulation: With patient Time For Goal Achievement: 07/04/17 Potential to Achieve Goals: Good    Frequency 7X/week    Barriers to discharge        Co-evaluation               AM-PAC PT "6 Clicks" Daily Activity  Outcome Measure Difficulty turning over in bed (including adjusting bedclothes, sheets and blankets)?: Total Difficulty moving from lying on back to sitting on the side of the bed? : Total Difficulty sitting down on and standing up from a chair with arms (e.g., wheelchair, bedside commode, etc,.)?: Total Help needed moving to and from a bed to chair (including a wheelchair)?: A Little Help needed walking in hospital room?: A Little Help needed climbing 3-5 steps with a railing? : A Little 6 Click Score: 12    End of Session Equipment Utilized During Treatment: Gait belt Activity Tolerance: Patient tolerated treatment well Patient left: in bed;with call bell/phone within reach;with family/visitor present Nurse Communication: Mobility status PT Visit Diagnosis: Unsteadiness on feet (R26.81);Difficulty in walking, not elsewhere classified (R26.2)    Time: 1610-9604 PT Time Calculation (min) (ACUTE ONLY): 37 min   Charges:   PT Evaluation $PT Eval Low Complexity: 1 Procedure PT Treatments $Gait Training: 8-22 mins   PT G Codes:   PT G-Codes **NOT FOR INPATIENT CLASS** Functional Assessment Tool Used: Clinical judgement Functional Limitation: Mobility: Walking and moving around Mobility: Walking and Moving Around Current Status (V4098): At least 20 percent but less than 40 percent impaired, limited or restricted Mobility: Walking and Moving Around Goal Status [id number]): At least 1 percent but less than 20 percent impaired, limited or restricted    Pg [phone]   Tajha Sammarco 07/02/2017, 4:51 PM

## 2017-07-02 NOTE — Progress Notes (Signed)
Portable AP and Lateral Right Knee X-rays done. 

## 2017-07-03 DIAGNOSIS — M1711 Unilateral primary osteoarthritis, right knee: Secondary | ICD-10-CM | POA: Diagnosis not present

## 2017-07-03 MED ORDER — ASPIRIN 325 MG PO TBEC: 325.0000 mg | DELAYED_RELEASE_TABLET | Freq: Two times a day (BID) | ORAL | 0 refills | Status: DC

## 2017-07-03 MED ORDER — METHOCARBAMOL 500 MG PO TABS: 500.0000 mg | ORAL_TABLET | Freq: Four times a day (QID) | ORAL | 0 refills | Status: DC | PRN

## 2017-07-03 MED ORDER — OXYCODONE-ACETAMINOPHEN 5-325 MG PO TABS: 1.0000 | ORAL_TABLET | ORAL | 0 refills | Status: DC | PRN

## 2017-07-03 NOTE — Progress Notes (Signed)
Subjective: 1 Day Post-Op Procedure(s) (LRB): RIGHT UNICOMPARTMENTAL KNEE ARTHROPLASTY (Right) Patient reports pain as moderate.  Was a little hypotensive when up earlier, but normal BP now.  My be pain med related, but feels well right now.  She would still like to go home this afternoon after her second therapy sesion.  Objective: Vital signs in last 24 hours: Temp:  [98 F (36.7 C)-98.7 F (37.1 C)] 98.7 F (37.1 C) (07/07 0927) Pulse Rate:  [60-76] 60 (07/07 0927) Resp:  [14-18] 17 (07/07 0927) BP: (89-137)/(58-81) 130/75 (07/07 0927) SpO2:  [98 %-100 %] 100 % (07/07 0927)  Intake/Output from previous day: 07/06 0701 - 07/07 0700 In: 4752.5 [P.O.:820; I.V.:3882.5; IV Piggyback:50] Out: 5200 [Urine:5100; Blood:100] Intake/Output this shift: Total I/O In: 480 [P.O.:480] Out: -   No results for input(s): HGB in the last 72 hours. No results for input(s): WBC, RBC, HCT, PLT in the last 72 hours. No results for input(s): NA, K, CL, CO2, BUN, CREATININE, GLUCOSE, CALCIUM in the last 72 hours. No results for input(s): LABPT, INR in the last 72 hours.  Sensation intact distally Intact pulses distally Dorsiflexion/Plantar flexion intact Incision: dressing C/D/I No cellulitis present Compartment soft  Assessment/Plan: 1 Day Post-Op Procedure(s) (LRB): RIGHT UNICOMPARTMENTAL KNEE ARTHROPLASTY (Right) Up with therapy Discharge home with home health this afternoon.  Kathryne HitchChristopher Y Emalea Mix 07/03/2017, 11:25 AM

## 2017-07-03 NOTE — Progress Notes (Signed)
Occupational Therapy Evaluation Patient Details Name: [name] MRN: [id number] DOB: December 17, 1956 Today's Date: 07/03/2017    History of Present Illness Pt s/p R UKR   Clinical Impression   All OT education completed and pt questions answered. No further OT needs at this time; will sign off.    Follow Up Recommendations  No OT follow up;Supervision/Assistance - 24 hour    Equipment Recommendations  3 in 1 bedside commode    Recommendations for Other Services       Precautions / Restrictions Precautions Precautions: Fall;Knee Restrictions Weight Bearing Restrictions: No Other Position/Activity Restrictions: WBAT      Mobility Bed Mobility               General bed mobility comments: NT -- OOB in recliner  Transfers Overall transfer level: Needs assistance Equipment used: Rolling walker (2 wheeled) Transfers: Sit to/from Stand Sit to Stand: Min guard              Balance                                           ADL either performed or assessed with clinical judgement   ADL Overall ADL's : Needs assistance/impaired Eating/Feeding: Independent;Sitting   Grooming: Set up;Sitting           Upper Body Dressing : Set up;Sitting   Lower Body Dressing: Minimal assistance;Sit to/from stand   Toilet Transfer: Min guard;Ambulation;BSC;RW   Toileting- Architect and Hygiene: Min guard;Sit to/from Nurse, children's Details (indicate cue type and reason): reviewed shower transfer technique with RW; pt declined to practice and states she will do sponge bathing for now Functional mobility during ADLs: Min guard;Rolling walker       Vision         Perception     Praxis      Pertinent Vitals/Pain Pain Assessment: 0-10 Pain Score: 3  Pain Location: R knee Pain Descriptors / Indicators: Aching;Sore Pain Intervention(s): Monitored during session;Ice applied     Hand Dominance     Extremity/Trunk  Assessment Upper Extremity Assessment Upper Extremity Assessment: Overall WFL for tasks assessed   Lower Extremity Assessment Lower Extremity Assessment: Defer to PT evaluation       Communication Communication Communication: No difficulties   Cognition Arousal/Alertness: Awake/alert Behavior During Therapy: WFL for tasks assessed/performed Overall Cognitive Status: Within Functional Limits for tasks assessed                                     General Comments       Exercises     Shoulder Instructions      Home Living Family/patient expects to be discharged to:: Private residence Living Arrangements: Spouse/significant other Available Help at Discharge: Family Type of Home: House Home Access: Stairs to enter [employer] of Steps: 4 Entrance Stairs-Rails: Right;Left Home Layout: Two level;Able to live on main level with bedroom/bathroom Alternate Level Stairs-Number of Steps: 14 Alternate Level Stairs-Rails: Right Bathroom Shower/Tub: Producer, television/film/video: Standard Bathroom Accessibility: Yes How Accessible: Accessible via walker Home Equipment: Walker - 2 wheels;Cane - single point          Prior Functioning/Environment Level of Independence: Independent  OT Problem List: Decreased range of motion;Decreased knowledge of use of DME or AE;Pain      OT Treatment/Interventions:      OT Goals(Current goals can be found in the care plan section) Acute Rehab OT Goals Patient Stated Goal: Regain IND ASAP to resume care giving for husband with stage !V Ca OT Goal Formulation: All assessment and education complete, DC therapy  OT Frequency:     Barriers to D/C:            Co-evaluation              AM-PAC PT "6 Clicks" Daily Activity     Outcome Measure Help from another person eating meals?: None Help from another person taking care of personal grooming?: None Help from another person  toileting, which includes using toliet, bedpan, or urinal?: A Little Help from another person bathing (including washing, rinsing, drying)?: A Little Help from another person to put on and taking off regular upper body clothing?: None Help from another person to put on and taking off regular lower body clothing?: A Little 6 Click Score: 21   End of Session Equipment Utilized During Treatment: Rolling walker Nurse Communication: Other (comment) (pt ready to d/c from OT standpoint)  Activity Tolerance: Patient tolerated treatment well Patient left: in chair;with call bell/phone within reach;with family/visitor present  OT Visit Diagnosis: Muscle weakness (generalized) (M62.81);Unsteadiness on feet (R26.81)                Time: [id number] OT Time Calculation (min): 15 min Charges:  OT General Charges $OT Visit: 1 Procedure OT Evaluation $OT Eval Low Complexity: 1 Procedure G-Codes: OT G-codes **NOT FOR INPATIENT CLASS** Functional Assessment Tool Used: AM-PAC 6 Clicks Daily Activity     Clevester Helzer A Genna Casimir 07/03/2017, 3:26 PM

## 2017-07-03 NOTE — Discharge Summary (Signed)
Patient ID: [name] Markuson MRN: [id number] DOB/AGE: 07/24/1956 61 y.o.  Admit date: 07/02/2017 Discharge date: 07/03/2017  Admission Diagnoses:  Principal Problem:   Unilateral primary osteoarthritis, right knee Active Problems:   Status post right partial knee replacement   Discharge Diagnoses:  Same  Past Medical History:  Diagnosis Date  . Arthritis   . GERD (gastroesophageal reflux disease)   . HLD (hyperlipidemia)   . Hypertension   . Pneumonia    4 years ago     Surgeries: Procedure(s): RIGHT UNICOMPARTMENTAL KNEE ARTHROPLASTY on 07/02/2017   Consultants:   Discharged Condition: Improved  Hospital Course: [name] Soden is an 61 y.o. female who was admitted 07/02/2017 for operative treatment ofUnilateral primary osteoarthritis, right knee. Patient has severe unremitting pain that affects sleep, daily activities, and work/hobbies. After pre-op clearance the patient was taken to the operating room on 07/02/2017 and underwent  Procedure(s): RIGHT UNICOMPARTMENTAL KNEE ARTHROPLASTY.    Patient was given perioperative antibiotics: Anti-infectives    Start     Dose/Rate Route Frequency Ordered Stop   07/02/17 1400  ceFAZolin (ANCEF) IVPB 1 g/50 mL premix     1 g 100 mL/hr over 30 Minutes Intravenous Every 6 hours 07/02/17 1112 07/02/17 2034   07/02/17 0523  ceFAZolin (ANCEF) IVPB 2g/100 mL premix     2 g 200 mL/hr over 30 Minutes Intravenous On call to O.R. 07/02/17 [id number] 07/02/17 0745       Patient was given sequential compression devices, early ambulation, and chemoprophylaxis to prevent DVT.  Patient benefited maximally from hospital stay and there were no complications.    Recent vital signs: Patient Vitals for the past 24 hrs:  BP Temp Temp src Pulse Resp SpO2  07/03/17 0927 130/75 98.7 F (37.1 C) Oral 60 17 100 %  07/03/17 0836 (!) 89/58 - - 62 - -  07/03/17 0535 119/69 98.3 F (36.8 C) Oral 69 18 99 %  07/03/17 0051 129/66 98.1 F (36.7 C) Oral 71 17 99 %   07/02/17 2053 122/67 98.2 F (36.8 C) Oral 76 16 98 %  07/02/17 1411 137/74 - - 72 15 100 %  07/02/17 1306 132/66 98 F (36.7 C) Oral 72 14 100 %  07/02/17 1212 133/81 98.1 F (36.7 C) Oral 71 15 100 %     Recent laboratory studies: No results for input(s): WBC, HGB, HCT, PLT, NA, K, CL, CO2, BUN, CREATININE, GLUCOSE, INR, CALCIUM in the last 72 hours.  Invalid input(s): PT, 2   Discharge Medications:   Allergies as of 07/03/2017      Reactions   Statins    Short term memory loss      Medication List    STOP taking these medications   losartan-hydrochlorothiazide 50-12.5 MG tablet Commonly known as:  HYZAAR     TAKE these medications   aspirin 325 MG EC tablet Take 1 tablet (325 mg total) by mouth 2 (two) times daily after a meal.   methocarbamol 500 MG tablet Commonly known as:  ROBAXIN Take 1 tablet (500 mg total) by mouth every 6 (six) hours as needed for muscle spasms.   oxyCODONE-acetaminophen 5-325 MG tablet Commonly known as:  ROXICET Take 1-2 tablets by mouth every 4 (four) hours as needed.   ranitidine 150 MG tablet Commonly known as:  ZANTAC Take 150 mg by mouth daily.   SUPER B COMPLEX/C PO Take 1 tablet by mouth daily.            Durable Medical Equipment  Start     Ordered   07/02/17 1113  DME Walker rolling  Once    Question:  Patient needs a walker to treat with the following condition  Answer:  Status post right partial knee replacement   07/02/17 1112   07/02/17 1113  DME 3 n 1  Once     07/02/17 1112      Diagnostic Studies: Dg Knee Right Port  Result Date: 07/02/2017 CLINICAL DATA:  Post RIGHT partial knee replacement EXAM: PORTABLE RIGHT KNEE - 1-2 VIEW COMPARISON:  Portable exam 0926 hours compared to MR RIGHT knee 01/27/2016 FINDINGS: Unicompartmental joint prosthesis at medial compartment RIGHT knee. Bones appear demineralized. No acute fracture, dislocation, or bone destruction. Joint fluid and air within suprapatellar  recess. IMPRESSION: Unicompartmental joint prosthesis at medial compartment RIGHT knee. No acute abnormalities. Electronically Signed   By: Ulyses Southward M.D.   On: 07/02/2017 10:00    Disposition: to home  Discharge Instructions    Discharge patient    Complete by:  As directed    Can discharge to home later this afternoon after therapy and if she feels well.   Discharge disposition:  01-Home or Self Care   Discharge patient date:  07/03/2017      Follow-up Information    Kathryne Hitch, MD Follow up in 2 week(s).   Specialty:  Orthopedic Surgery Contact information: [address] [place] [address] [phone]            Signed: Kathryne Hitch 07/03/2017, 11:31 AM

## 2017-07-03 NOTE — Progress Notes (Signed)
Physical Therapy Treatment Patient Details Name: [name] MRN: [id number] DOB: 1956/08/16 Today's Date: 07/03/2017    History of Present Illness Pt s/p R UKR    PT Comments    Pt very motivated but ltd this am by onset dizziness with ambulation - BP 89/58 - RN aware.  Follow Up Recommendations  Home health PT     Equipment Recommendations  None recommended by PT    Recommendations for Other Services OT consult     Precautions / Restrictions Precautions Precautions: Fall;Knee Restrictions Weight Bearing Restrictions: No Other Position/Activity Restrictions: WBAT    Mobility  Bed Mobility Overal bed mobility: Needs Assistance Bed Mobility: Supine to Sit     Supine to sit: Min guard     General bed mobility comments: cues for sequence and use of L LE to self assist  Transfers Overall transfer level: Needs assistance Equipment used: Rolling walker (2 wheeled) Transfers: Sit to/from Stand Sit to Stand: Min guard         General transfer comment: cues for LE management and use of UEs to self assist  Ambulation/Gait Ambulation/Gait assistance: Min assist;Min guard Ambulation Distance (Feet): 65 Feet Assistive device: Rolling walker (2 wheeled) Gait Pattern/deviations: Step-to pattern;Decreased step length - right;Decreased step length - left;Shuffle;Trunk flexed Gait velocity: decr   General Gait Details: cues for sequence, posture and position from [employer]            Wheelchair Mobility    Modified Rankin (Stroke Patients Only)       Balance Overall balance assessment: No apparent balance deficits (not formally assessed)                                          Cognition Arousal/Alertness: Awake/alert Behavior During Therapy: WFL for tasks assessed/performed Overall Cognitive Status: Within Functional Limits for tasks assessed                                        Exercises Total Joint  Exercises Ankle Circles/Pumps: AROM;Both;15 reps;Supine Quad Sets: AROM;Both;15 reps;Supine Heel Slides: AAROM;Right;15 reps;Supine Straight Leg Raises: AAROM;AROM;Right;15 reps;Supine Goniometric ROM: AAROM at knee -5 - 55    General Comments        Pertinent Vitals/Pain Pain Assessment: 0-10 Pain Score: 5  Pain Location: R knee Pain Descriptors / Indicators: Aching;Sore Pain Intervention(s): Limited activity within patient's tolerance;Monitored during session;Premedicated before session;Ice applied    Home Living Family/patient expects to be discharged to:: Private residence Living Arrangements: Spouse/significant other Available Help at Discharge: Family Type of Home: House Home Access: Stairs to enter Entrance Stairs-Rails: Right;Left Home Layout: Two level;Able to live on main level with bedroom/bathroom Home Equipment: Dan Humphreys - 2 wheels;Cane - single point      Prior Function Level of Independence: Independent          PT Goals (current goals can now be found in the care plan section) Acute Rehab PT Goals Patient Stated Goal: Regain IND ASAP to resume care giving for husband with stage !V Ca PT Goal Formulation: With patient Time For Goal Achievement: 07/04/17 Potential to Achieve Goals: Good Progress towards PT goals: Progressing toward goals    Frequency    7X/week      PT Plan Current plan remains appropriate    Co-evaluation  AM-PAC PT "6 Clicks" Daily Activity  Outcome Measure  Difficulty turning over in bed (including adjusting bedclothes, sheets and blankets)?: A Lot Difficulty moving from lying on back to sitting on the side of the bed? : A Lot Difficulty sitting down on and standing up from a chair with arms (e.g., wheelchair, bedside commode, etc,.)?: A Little Help needed moving to and from a bed to chair (including a wheelchair)?: A Little Help needed walking in hospital room?: A Little Help needed climbing 3-5 steps with  a railing? : A Little 6 Click Score: 16    End of Session Equipment Utilized During Treatment: Gait belt Activity Tolerance: Patient tolerated treatment well Patient left: in chair;with call bell/phone within reach Nurse Communication: Mobility status;Other (comment) (Pt BP drop to 89/58 with ambulation) PT Visit Diagnosis: Unsteadiness on feet (R26.81);Difficulty in walking, not elsewhere classified (R26.2)     Time: [id number] PT Time Calculation (min) (ACUTE ONLY): 54 min  Charges:  $Gait Training: 8-22 mins $Therapeutic Exercise: 8-22 mins $Therapeutic Activity: 8-22 mins                    G Codes:       Pg [phone]    [name] 07/03/2017, 4:25 PM

## 2017-07-03 NOTE — Progress Notes (Signed)
OT Cancellation Note  Patient Details Name: Stanford BreedMargie Symonette MRN: [id number] DOB: 1956-07-27   Cancelled Treatment:    Reason Eval/Treat Not Completed: Medical issues which prohibited therapy -- BP dropped to 89/58 during PT session, pt symptomatic with dizziness, sweating, diaphoresis. Will hold OT eval for now and check back later.  Katniss Weedman A Keishawn Darsey 07/03/2017, 8:46 AM

## 2017-07-03 NOTE — Discharge Instructions (Signed)
INSTRUCTIONS AFTER JOINT REPLACEMENT  ° °o Remove items at home which could result in a fall. This includes throw rugs or furniture in walking pathways °o ICE to the affected joint every three hours while awake for 30 minutes at a time, for at least the first 3-5 days, and then as needed for pain and swelling.  Continue to use ice for pain and swelling. You may notice swelling that will progress down to the foot and ankle.  This is normal after surgery.  Elevate your leg when you are not up walking on it.   °o Continue to use the breathing machine you got in the hospital (incentive spirometer) which will help keep your temperature down.  It is common for your temperature to cycle up and down following surgery, especially at night when you are not up moving around and exerting yourself.  The breathing machine keeps your lungs expanded and your temperature down. ° ° °DIET:  As you were doing prior to hospitalization, we recommend a well-balanced diet. ° °DRESSING / WOUND CARE / SHOWERING ° °Keep the surgical dressing until follow up.  The dressing is water proof, so you can shower without any extra covering.  IF THE DRESSING FALLS OFF or the wound gets wet inside, change the dressing with sterile gauze.  Please use good hand washing techniques before changing the dressing.  Do not use any lotions or creams on the incision until instructed by your surgeon.   ° °ACTIVITY ° °o Increase activity slowly as tolerated, but follow the weight bearing instructions below.   °o No driving for 6 weeks or until further direction given by your physician.  You cannot drive while taking narcotics.  °o No lifting or carrying greater than 10 lbs. until further directed by your surgeon. °o Avoid periods of inactivity such as sitting longer than an hour when not asleep. This helps prevent blood clots.  °o You may return to work once you are authorized by your doctor.  ° ° ° °WEIGHT BEARING  ° °Weight bearing as tolerated with assist  device (walker, cane, etc) as directed, use it as long as suggested by your surgeon or therapist, typically at least 4-6 weeks. ° ° °EXERCISES ° °Results after joint replacement surgery are often greatly improved when you follow the exercise, range of motion and muscle strengthening exercises prescribed by your doctor. Safety measures are also important to protect the joint from further injury. Any time any of these exercises cause you to have increased pain or swelling, decrease what you are doing until you are comfortable again and then slowly increase them. If you have problems or questions, call your caregiver or physical therapist for advice.  ° °Rehabilitation is important following a joint replacement. After just a few days of immobilization, the muscles of the leg can become weakened and shrink (atrophy).  These exercises are designed to build up the tone and strength of the thigh and leg muscles and to improve motion. Often times heat used for twenty to thirty minutes before working out will loosen up your tissues and help with improving the range of motion but do not use heat for the first two weeks following surgery (sometimes heat can increase post-operative swelling).  ° °These exercises can be done on a training (exercise) mat, on the floor, on a table or on a bed. Use whatever works the best and is most comfortable for you.    Use music or television while you are exercising so that   the exercises are a pleasant break in your day. This will make your life better with the exercises acting as a break in your routine that you can look forward to.   Perform all exercises about fifteen times, three times per day or as directed.  You should exercise both the operative leg and the other leg as well. ° °Exercises include: °  °• Quad Sets - Tighten up the muscle on the front of the thigh (Quad) and hold for 5-10 seconds.   °• Straight Leg Raises - With your knee straight (if you were given a brace, keep it on),  lift the leg to 60 degrees, hold for 3 seconds, and slowly lower the leg.  Perform this exercise against resistance later as your leg gets stronger.  °• Leg Slides: Lying on your back, slowly slide your foot toward your buttocks, bending your knee up off the floor (only go as far as is comfortable). Then slowly slide your foot back down until your leg is flat on the floor again.  °• Angel Wings: Lying on your back spread your legs to the side as far apart as you can without causing discomfort.  °• Hamstring Strength:  Lying on your back, push your heel against the floor with your leg straight by tightening up the muscles of your buttocks.  Repeat, but this time bend your knee to a comfortable angle, and push your heel against the floor.  You may put a pillow under the heel to make it more comfortable if necessary.  ° °A rehabilitation program following joint replacement surgery can speed recovery and prevent re-injury in the future due to weakened muscles. Contact your doctor or a physical therapist for more information on knee rehabilitation.  ° ° °CONSTIPATION ° °Constipation is defined medically as fewer than three stools per week and severe constipation as less than one stool per week.  Even if you have a regular bowel pattern at home, your normal regimen is likely to be disrupted due to multiple reasons following surgery.  Combination of anesthesia, postoperative narcotics, change in appetite and fluid intake all can affect your bowels.  ° °YOU MUST use at least one of the following options; they are listed in order of increasing strength to get the job done.  They are all available over the counter, and you may need to use some, POSSIBLY even all of these options:   ° °Drink plenty of fluids (prune juice may be helpful) and high fiber foods °Colace 100 mg by mouth twice a day  °Senokot for constipation as directed and as needed Dulcolax (bisacodyl), take with full glass of water  °Miralax (polyethylene glycol)  once or twice a day as needed. ° °If you have tried all these things and are unable to have a bowel movement in the first 3-4 days after surgery call either your surgeon or your primary doctor.   ° °If you experience loose stools or diarrhea, hold the medications until you stool forms back up.  If your symptoms do not get better within 1 week or if they get worse, check with your doctor.  If you experience "the worst abdominal pain ever" or develop nausea or vomiting, please contact the office immediately for further recommendations for treatment. ° ° °ITCHING:  If you experience itching with your medications, try taking only a single pain pill, or even half a pain pill at a time.  You can also use Benadryl over the counter for itching or also to   help with sleep.   TED HOSE STOCKINGS:  Use stockings on both legs until for at least 2 weeks or as directed by physician office. They may be removed at night for sleeping.  MEDICATIONS:  See your medication summary on the After Visit Summary that nursing will review with you.  You may have some home medications which will be placed on hold until you complete the course of blood thinner medication.  It is important for you to complete the blood thinner medication as prescribed.  PRECAUTIONS:  If you experience chest pain or shortness of breath - call 911 immediately for transfer to the hospital emergency department.   If you develop a fever greater that 101 F, purulent drainage from wound, increased redness or drainage from wound, foul odor from the wound/dressing, or calf pain - CONTACT YOUR SURGEON.                                                   FOLLOW-UP APPOINTMENTS:  If you do not already have a post-op appointment, please call the office for an appointment to be seen by your surgeon.  Guidelines for how soon to be seen are listed in your After Visit Summary, but are typically between 1-4 weeks after surgery.  OTHER INSTRUCTIONS:   Knee  Replacement:  Do not place pillow under knee, focus on keeping the knee straight while resting. CPM instructions: 0-90 degrees, 2 hours in the morning, 2 hours in the afternoon, and 2 hours in the evening. Place foam block, curve side up under heel at all times except when in CPM or when walking.  DO NOT modify, tear, cut, or change the foam block in any way.  MAKE SURE YOU:   Understand these instructions.   Get help right away if you are not doing well or get worse.    Thank you for letting us be a part of your medical care team.  It is a privilege we respect greatly.  We hope these instructions will help you stay on track for a fast and full recovery!    Hold all your blood pressure medications for the next 4-5 days.  However, do check your blood pressure regularly.  If you blood pressure stays above 140/80, then resume your medications.

## 2017-07-03 NOTE — Care Management Note (Signed)
Case Management Note  Patient Details  Name: [name] MRN: [id number] Date of Birth: 1956-07-15  Subjective/Objective:       S/p Right partial knee replacement             Action/Plan: Discharge Planning: NCM spoke to pt and offered choice for HH/list provided. Pt agreeable to [facility] for HHPT. Pt has RW at home. Requesting 3n1 bedside commode. Contacted [facility] DME rep for equipment to be delivered to room prior to dc.    Expected Discharge Date:  07/03/17               Expected Discharge Plan:  Home w Home Health Services  In-House Referral:  NA  Discharge planning Services  CM Consult  Post Acute Care Choice:  Home Health Choice offered to:  Patient  DME Arranged:  3-N-1 DME Agency:   [facility]  HH Arranged:  PT HH Agency[facility]  [facility]  Status of Service:  Completed, signed off  If discussed at [employer] Meetings, dates discussed:    Additional Comments:  Elliot CousinShavis, Hubbert Landrigan Ellen, RN 07/03/2017, 12:00 PM

## 2017-07-03 NOTE — Progress Notes (Signed)
Physical Therapy Treatment Patient Details Name: [name] MRN: [id number] DOB: Jun 08, 1956 Today's Date: 07/03/2017    History of Present Illness Pt s/p R UKR    PT Comments    Pt cooperative and eager for dc.  Pt ambulated and negotiated stairs with no reports of dizziness and BP 125/72 at end of session.   Follow Up Recommendations  Home health PT     Equipment Recommendations  None recommended by PT    Recommendations for Other Services OT consult     Precautions / Restrictions Precautions Precautions: Fall;Knee Restrictions Weight Bearing Restrictions: No Other Position/Activity Restrictions: WBAT    Mobility  Bed Mobility Overal bed mobility: Needs Assistance Bed Mobility: Supine to Sit     Supine to sit: Min guard     General bed mobility comments: NT - Pt OOB and requests back to recliner  Transfers Overall transfer level: Needs assistance Equipment used: Rolling walker (2 wheeled) Transfers: Sit to/from Stand Sit to Stand: Min guard;Supervision         General transfer comment: min cues for LE management and use of UEs to self assist  Ambulation/Gait Ambulation/Gait assistance: Min guard;Supervision Ambulation Distance (Feet): 50 Feet Assistive device: Rolling walker (2 wheeled) Gait Pattern/deviations: Step-to pattern;Decreased step length - right;Decreased step length - left;Shuffle;Trunk flexed Gait velocity: decr Gait velocity interpretation: Below normal speed for age/gender General Gait Details: cues for sequence, posture and position from RW   Stairs Stairs: Yes   Stair Management: One rail Right;Step to pattern;Forwards;With cane Number of Stairs: 4 General stair comments: cues for sequence and foot/cane placement,  Son present and assisting.  Written instructions provided  Wheelchair Mobility    Modified Rankin (Stroke Patients Only)       Balance Overall balance assessment: No apparent balance deficits (not formally  assessed)                                          Cognition Arousal/Alertness: Awake/alert Behavior During Therapy: WFL for tasks assessed/performed Overall Cognitive Status: Within Functional Limits for tasks assessed                                        Exercises Total Joint Exercises Ankle Circles/Pumps: AROM;Both;15 reps;Supine Quad Sets: AROM;Both;15 reps;Supine Heel Slides: AAROM;Right;15 reps;Supine Straight Leg Raises: AAROM;AROM;Right;15 reps;Supine Goniometric ROM: AAROM at knee -5 - 55    General Comments        Pertinent Vitals/Pain Pain Assessment: 0-10 Pain Score: 5  Pain Location: R knee Pain Descriptors / Indicators: Aching;Tightness Pain Intervention(s): Limited activity within patient's tolerance;Monitored during session;Premedicated before session;Ice applied    Home Living Family/patient expects to be discharged to:: Private residence Living Arrangements: Spouse/significant other Available Help at Discharge: Family Type of Home: House Home Access: Stairs to enter Entrance Stairs-Rails: Right;Left Home Layout: Two level;Able to live on main level with bedroom/bathroom Home Equipment: Dan Humphreys - 2 wheels;Cane - single point      Prior Function Level of Independence: Independent          PT Goals (current goals can now be found in the care plan section) Acute Rehab PT Goals Patient Stated Goal: Regain IND ASAP to resume care giving for husband with stage !V Ca PT Goal Formulation: With patient Time For Goal Achievement: 07/04/17 Potential  to Achieve Goals: Good Progress towards PT goals: Progressing toward goals    Frequency    7X/week      PT Plan Current plan remains appropriate    Co-evaluation              AM-PAC PT "6 Clicks" Daily Activity  Outcome Measure  Difficulty turning over in bed (including adjusting bedclothes, sheets and blankets)?: A Lot Difficulty moving from lying on back  to sitting on the side of the bed? : A Little Difficulty sitting down on and standing up from a chair with arms (e.g., wheelchair, bedside commode, etc,.)?: A Little Help needed moving to and from a bed to chair (including a wheelchair)?: A Little Help needed walking in hospital room?: A Little Help needed climbing 3-5 steps with a railing? : A Little 6 Click Score: 17    End of Session Equipment Utilized During Treatment: Gait belt Activity Tolerance: Patient tolerated treatment well Patient left: in chair;with call bell/phone within reach Nurse Communication: Mobility status PT Visit Diagnosis: Unsteadiness on feet (R26.81);Difficulty in walking, not elsewhere classified (R26.2)     Time: [id number] PT Time Calculation (min) (ACUTE ONLY): 21 min  Charges:  $Gait Training: 8-22 mins $Therapeutic Exercise: 8-22 mins $Therapeutic Activity: 8-22 mins                    G Codes:       Pg [phone]    Sweet Jarvis 07/03/2017, 4:36 PM

## 2017-07-03 NOTE — Progress Notes (Signed)
Discharged from floor via w/c for transport home by car. Son & belongings with pt. No changes in assessment. Yonatan Guitron  

## 2017-07-03 NOTE — Progress Notes (Signed)
Dr Magnus IvanBlackman in to see pt. Informed him re pt's low BP & that she no longer has a PCP. He stated he would advise her on BP med parameters for immediate post discharge & instruct her to seek new PCP. Curtina Grills, [employer]

## 2017-07-05 NOTE — Op Note (Signed)
NAME:  Stanford BreedSTALEY, Connie                    ACCOUNT NO.:  MEDICAL RECORD NO.:  [id number]  LOCATION:                                 FACILITY:  PHYSICIAN:  Vanita PandaChristopher Y. 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  POSTOPERATIVE DIAGNOSIS:  Right knee severe medial compartment osteoarthritis and degenerative joint disease.  PROCEDURE:  Right partial/unicompartmental knee arthroplasty.  IMPLANTS:  [employer] unicompartmental knee with size right medium femur, right size C tibial tray, 5 mm thickness mobile bearing polyethylene insert.  SURGEON:  Vanita PandaChristopher Y. Magnus IvanBlackman, M.D.  ASSISTANT:  Richardean CanalGilbert Clark, P.A.  ANESTHESIA: 1. Right lower extremity adductor canal block. 2. Spinal.  TOURNIQUET TIME:  Under 1 hour.  ANTIBIOTICS:  2 g of IV Ancef.  BLOOD LOSS:  Less than 100 mL.  COMPLICATIONS:  None.  INDICATIONS:  [name] is a 61 year old patient, well known to me. She has medial compartment arthritis involving her right knee, this is very severe.  She has tried and failed all forms of conservative treatment including rest, ice, heat, antiinflammatories, steroid and hyaluronic acid injections, and an operative intervention with an arthroscopy of that knee.  Her pain is daily and it has detrimentally affected her activities of daily living, her mobility, and her quality of life.  At this point, she does wish to proceed with a partial knee arthroplasty given the majority of her disease is in the medial compartment of her knee.  She understands fully the risks and benefits of this surgery including the risk of acute blood loss anemia, nerve and vessel injury, fracture, infection, and DVT.  She understands our goals are to decrease pain, improve mobility, and overall improve  quality of life.  PROCEDURE DESCRIPTION:  After informed consent was obtained, appropriate right knee was marked and an adductor canal block was obtained by Anesthesia.  She was then brought to the operating room and placed supine on the operating table.  She was sat up and spinal anesthesia was obtained as well.  She was then laid supine again and a Foley catheter was placed.  Both legs were placed in well-leg holders with appropriate padding in all crucial areas.  A nonsterile tourniquet was placed around her upper right thigh and a leg holder at her thigh was placed proximal to the popliteal fossa.  Her right leg was then prepped and draped from the thigh down to the toes with DuraPrep and sterile drapes including a sterile stockinette.  Time-out was called and she was identified as correct patient, correct right knee.  We then used the Esmarch to wrap out the leg and the tourniquet was inflated to 300 mmHg.  I then made a medial incision directly over the medial compartment of her knee and dissected down the knee joint and carried out a medial parapatellar arthrotomy, finding a large joint effusion and significant cartilage wear on the medial aspect of her knee.  The lateral side looked good as well as the ACL and PCL.  There were  some mild cartilage changes of the patellofemoral joint, but she is asymptomatic from this.  We then used our femoral sizing spoon and chose the size medium femur.  Based off the sizing spoon, we used a size 4G clamp and set our tibial cutting guide off the tibial crest, correcting for neutral slope and varus and valgus. We then made our tibial cut with our reciprocating and sagittal saw without difficulty.  We were pleased to remove wafer of bone from the tibia and trialed a size C tibia.  We went then to the femur.  We put our femoral alignment guide into the intercondylar area and then made a line down the middle of the femur.  We made our drill pass  this, so we could then start the milling process.  We started with just 0 miller and put the trial medium femur followed by the C tibia and we trialed a 3 mm and 4 mm polyethylene insert.  The 4 mm, we felt good with the flexion and the 3 mm tight in extension and even 2 mm was slightly tight, so we decided to mill it down to a size 2 on the femur.  We milled the size 2 femur and then we were pleased with our balancing in flexion and extension with the size 4 polyethylene insert.  I felt that we would likely go with a size 5 based on loosening as we proceeded.  We had already made our posterior femoral cut and we made our finishing cuts on the femur.  We then went back to the tibia and made our keel cut on the tibia with the trial C right tibia followed by the medium femur.  We trialed a 4 mm polyethylene insert and then 5 mm and I was pleased with the flexion and extension, balancing with a 5 mm polyethylene insert. We then removed all trial components, removed osteophytes further from the knee.  We already removed remnants of the medial meniscus.  We then irrigated the soft tissue with normal saline solution and mixed our cement.  We were able to then cement the size right C tibial tray followed by the right medium femur.  We removed cement debris from the knee and then pressurized with a temporary polyethylene insert.  Once the cement had hardened, we removed cement debris from the knee and irrigated the knee again and placed the real size 5 mobile bearing insert because we felt though with the trials that was a better fit.  We were pleased with the flexion and extension balancing after this.  We then irrigated the knee with normal saline solution and closed the arthrotomy with interrupted #1 Vicryl suture followed by 0 Vicryl in the deep tissue, 2-0 Vicryl in the subcutaneous tissue, 4-0 Monocryl subcuticular stitch and Steri-Strips on the skin.  Well-padded sterile dressing was  applied.  She was taken to the recovery room in stable condition.  All final counts were correct.  There were no complications noted.  Of note, Richardean Canal, P.A., assisted in the entire case.  His assistance was crucial for facilitating all aspects of this case.     Vanita Panda. Magnus Ivan, M.D.     CYB/MEDQ  D:  07/02/2017  T:  07/02/2017  Job:  [id number]

## 2017-07-06 ENCOUNTER — Telehealth (INDEPENDENT_AMBULATORY_CARE_PROVIDER_SITE_OTHER): Payer: Self-pay | Admitting: Orthopaedic Surgery

## 2017-07-06 NOTE — Telephone Encounter (Signed)
Verbal given 

## 2017-07-06 NOTE — Telephone Encounter (Signed)
DORIAN WITH [facility] REQUESTING VERBAL FOR 3X WK FOR 3 WKS.  [phone]

## 2017-07-15 ENCOUNTER — Ambulatory Visit (INDEPENDENT_AMBULATORY_CARE_PROVIDER_SITE_OTHER): Payer: BLUE CROSS/BLUE SHIELD | Admitting: Orthopaedic Surgery

## 2017-07-15 ENCOUNTER — Encounter (INDEPENDENT_AMBULATORY_CARE_PROVIDER_SITE_OTHER): Payer: Self-pay | Admitting: Orthopaedic Surgery

## 2017-07-15 DIAGNOSIS — Z96651 Presence of right artificial knee joint: Secondary | ICD-10-CM

## 2017-07-15 MED ORDER — OXYCODONE-ACETAMINOPHEN 5-325 MG PO TABS: 1.0000 | ORAL_TABLET | Freq: Four times a day (QID) | ORAL | 0 refills | Status: DC | PRN

## 2017-07-15 NOTE — Progress Notes (Signed)
  The patient is 2 weeks tomorrow status post a right partial knee replacement. She is doing well. She family with a cane. She is going to home therapy. She says that the most she has flexed her knee thus far has been back to 97.  On examination her calf is soft. Incision looks great. I placed new Steri-Strips. Her extension is almost full and her flexion is to past 90. The knee feels ligamentously stable.  From her transition her to outpatient physical therapy to work on range of motion, balance, coordination and strengthening of her right knee. We'll see her back in 4 weeks to see how she doing overall. I will allow her to return to work on 08/09/2017. All questions were encouraged and answered. I did refill her Percocet.

## 2017-07-16 ENCOUNTER — Telehealth (INDEPENDENT_AMBULATORY_CARE_PROVIDER_SITE_OTHER): Payer: Self-pay | Admitting: Orthopaedic Surgery

## 2017-07-16 NOTE — Telephone Encounter (Signed)
Faxed to [facility] Connie [phone]

## 2017-07-16 NOTE — Telephone Encounter (Signed)
Please advise 

## 2017-07-16 NOTE — Telephone Encounter (Signed)
PT REQUESTED A WORK NOTE WITH RESTRICTIONS PLEASE.  [phone]

## 2017-07-16 NOTE — Telephone Encounter (Signed)
Ok to give a work note putting on it when she wants to return to work.  She should avoid standing for longer periods of time and be allowed to have frequent rest breaks.  She should avoid being down on her knees and no lifting greater than 20 lbs for the first 4-6 weeks while she is back at work.

## 2017-07-20 ENCOUNTER — Telehealth (INDEPENDENT_AMBULATORY_CARE_PROVIDER_SITE_OTHER): Payer: Self-pay | Admitting: Orthopaedic Surgery

## 2017-07-20 NOTE — Telephone Encounter (Signed)
LMOM verbal given

## 2017-07-20 NOTE — Telephone Encounter (Signed)
[name] STATED PT IS UNABLE TO ATTEND OUTPT THERAPY DUE TO NO TRANSPORTATION, PT REQUESTING CONTINUED THERAPY FOR 3-4 MORE WKS.  [phone]

## 2017-07-22 NOTE — Progress Notes (Signed)
   07/03/17 1500  OT Time Calculation  OT Start Time (ACUTE ONLY) 1453  OT Stop Time (ACUTE ONLY) 1508  OT Time Calculation (min) 15 min  OT G-codes **NOT FOR INPATIENT CLASS**  Functional Assessment Tool Used AM-PAC 6 Clicks Daily Activity  Functional Limitation Self care  Self Care Current Status (Z6109(G8987) CJ  Self Care Goal Status (U0454(G8988) CJ  Self Care Discharge Status (U9811(G8989) CJ  OT General Charges  $OT Visit 1 Procedure  OT Evaluation  $OT Eval Low Complexity 1 Procedure  for Vinie SillLeila Early OTR  Aloria Looper, OTR/L [phone] 07/22/2017

## 2017-08-10 ENCOUNTER — Encounter (INDEPENDENT_AMBULATORY_CARE_PROVIDER_SITE_OTHER): Payer: Self-pay

## 2017-08-10 ENCOUNTER — Telehealth (INDEPENDENT_AMBULATORY_CARE_PROVIDER_SITE_OTHER): Payer: Self-pay | Admitting: Orthopaedic Surgery

## 2017-08-10 NOTE — Telephone Encounter (Signed)
Patient called asked if she should stay out of work the rest of this week until she sees Dr Magnus IvanBlackman Monday because she is not feeling well. Patient said her right knee is swollen and sore. The number to contact patient  Is [phone]

## 2017-08-10 NOTE — Telephone Encounter (Signed)
Faxed note to [employer] at [phone]

## 2017-08-16 ENCOUNTER — Encounter (INDEPENDENT_AMBULATORY_CARE_PROVIDER_SITE_OTHER): Payer: Self-pay | Admitting: Orthopaedic Surgery

## 2017-08-16 ENCOUNTER — Ambulatory Visit (INDEPENDENT_AMBULATORY_CARE_PROVIDER_SITE_OTHER): Payer: BLUE CROSS/BLUE SHIELD | Admitting: Orthopaedic Surgery

## 2017-08-16 VITALS — Ht 65.0 in | Wt 214.0 lb

## 2017-08-16 DIAGNOSIS — Z96651 Presence of right artificial knee joint: Secondary | ICD-10-CM

## 2017-08-16 NOTE — Progress Notes (Signed)
The patient is now 45 days status post a right partial knee arthroplasty. She went back to work last week but after even the first day of a long work day her knee swelled up significantly. [place] was too [place] and they did send her home. She feels that she can go back to work starting tomorrow but may be for half days only and I agree with this. On examination her knee today her extension is full and her flexion is now past 95 and much better overall on the right knee the knee feels ligamentously stable and there is moderate swelling which is to be expected as she recovers from partial knee replacement surgery.  I do feel that she can go back to work tomorrow but I would like her on half days only for the next 2 weeks. She would need to avoid squatting activities and lift no greater than 10-15 pounds. She should avoid walking long distance and be allowed mainly sedentary type of work. I'll reevaluate her in 2 weeks see how she is doing overall.

## 2017-08-17 ENCOUNTER — Telehealth (INDEPENDENT_AMBULATORY_CARE_PROVIDER_SITE_OTHER): Payer: Self-pay | Admitting: Orthopaedic Surgery

## 2017-08-17 NOTE — Telephone Encounter (Signed)
Patient called needing note for her employer stating she can return back to work part time. Patient advised the note need to state any and all restrictions she may have. Patient asked if the note can be faxed to her employer. ([employer])   The number to contact patient is [phone]

## 2017-08-17 NOTE — Telephone Encounter (Signed)
I left a note/script on your keyboard the other day to fax to her employer.  Half-days only (4 hour days) for the next two weeks with mainly sitting-down type of work.  No walking long distances, no squating, and no standing for long periods of time.

## 2017-08-17 NOTE — Telephone Encounter (Signed)
I got it thanks

## 2017-08-17 NOTE — Telephone Encounter (Signed)
Please advise on restrictions she may have

## 2017-09-01 ENCOUNTER — Ambulatory Visit (INDEPENDENT_AMBULATORY_CARE_PROVIDER_SITE_OTHER): Payer: BLUE CROSS/BLUE SHIELD | Admitting: Physician Assistant

## 2017-09-01 DIAGNOSIS — Z96651 Presence of right artificial knee joint: Secondary | ICD-10-CM

## 2017-09-01 DIAGNOSIS — Z9889 Other specified postprocedural states: Secondary | ICD-10-CM

## 2017-09-01 NOTE — Progress Notes (Signed)
[name] returns today 2 months status post right knee unicompartmental knee arthroplasty. She states she is overall doing well. She has no complaints. She states that about 6 weeks something disc changed her knee pain diminished greatly and range of motion improved.  Physical exam:Right knee she has full extension flexion to 110. Surgical incisions healing well no signs of infection. Calf supple nontender. Stressing of the right knee reveals no laxity.  Impression: Status post right unicompartmental knee arthroplasty  Plan: Continue to work on strengthening right knee. She'll work on scar tissue mobilization. Follow-up with us in July 2019 sooner if there is any questions or concerns.

## 2018-07-04 ENCOUNTER — Ambulatory Visit (INDEPENDENT_AMBULATORY_CARE_PROVIDER_SITE_OTHER): Payer: BLUE CROSS/BLUE SHIELD | Admitting: Orthopaedic Surgery

## 2018-07-20 IMAGING — DX DG KNEE 1-2V PORT*R*
2 series · 2 of 2 positions shown · non-contrast
Comparison: Portable exam 3484 hours compared to MR RIGHT knee
01/27/2016

CLINICAL DATA: Post RIGHT partial knee replacement

EXAM:
PORTABLE RIGHT KNEE - 1-2 VIEW

[knee ap]
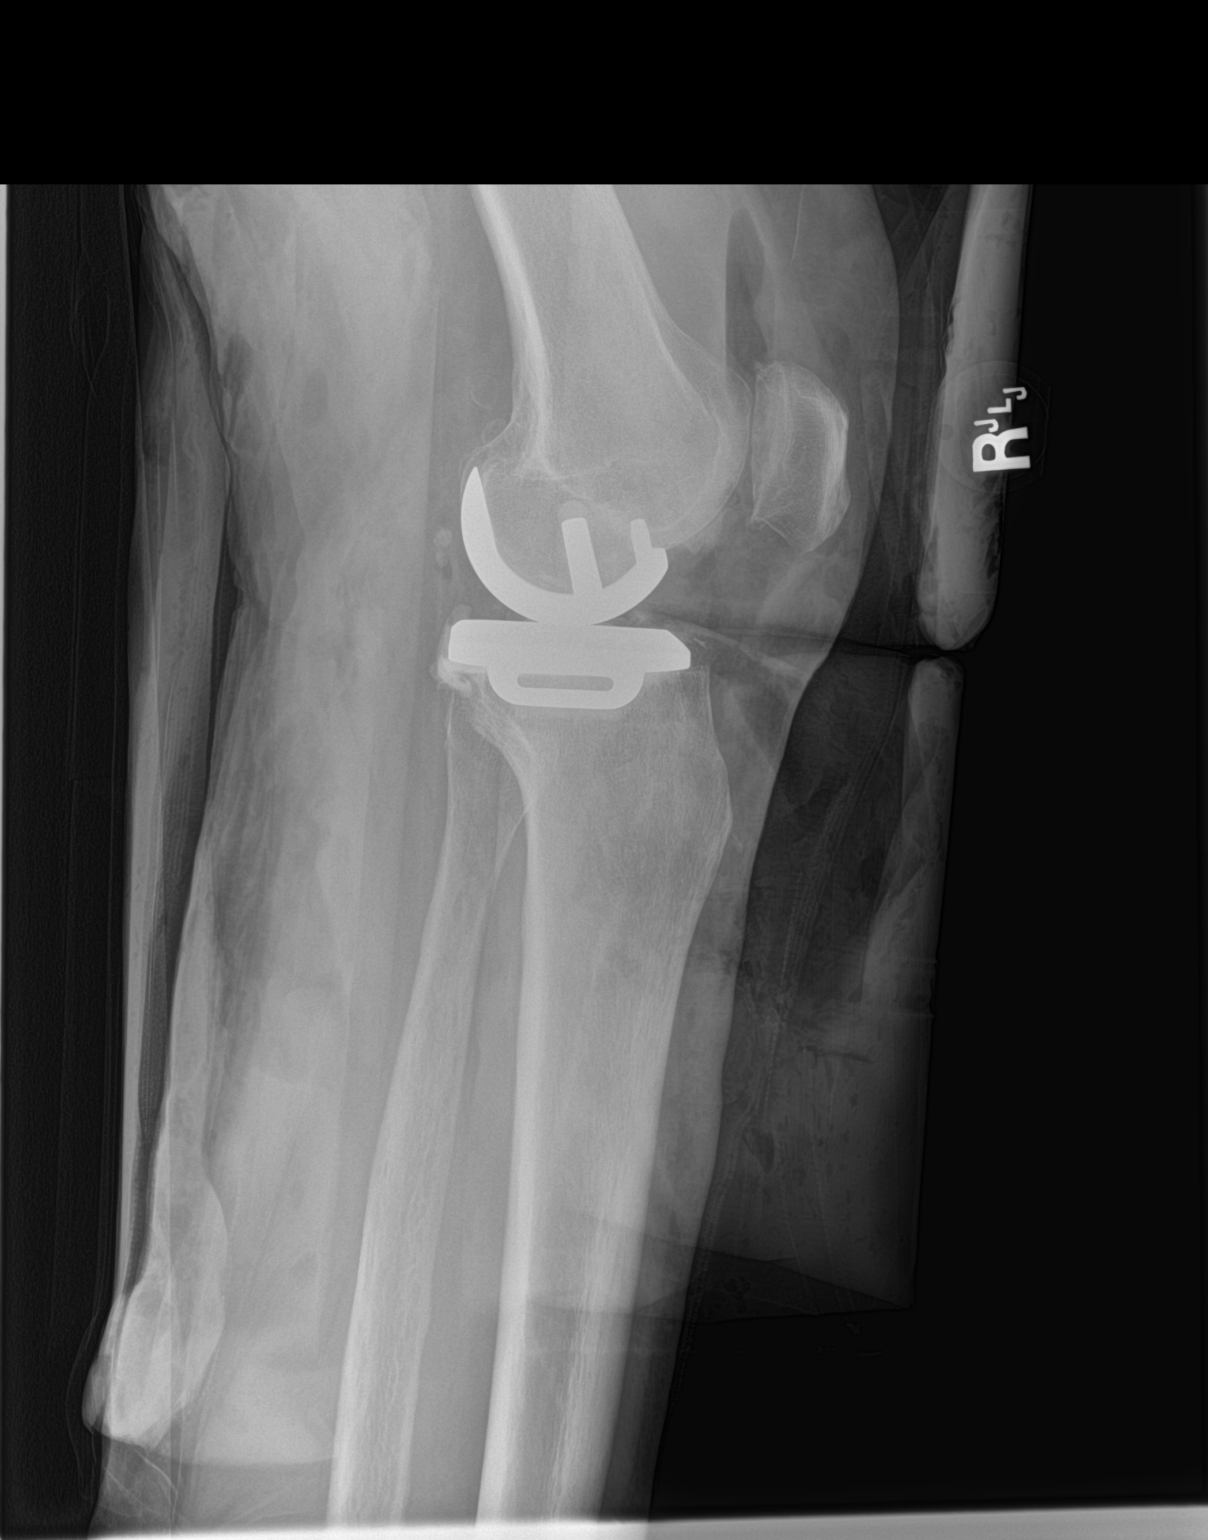

[knee lat]
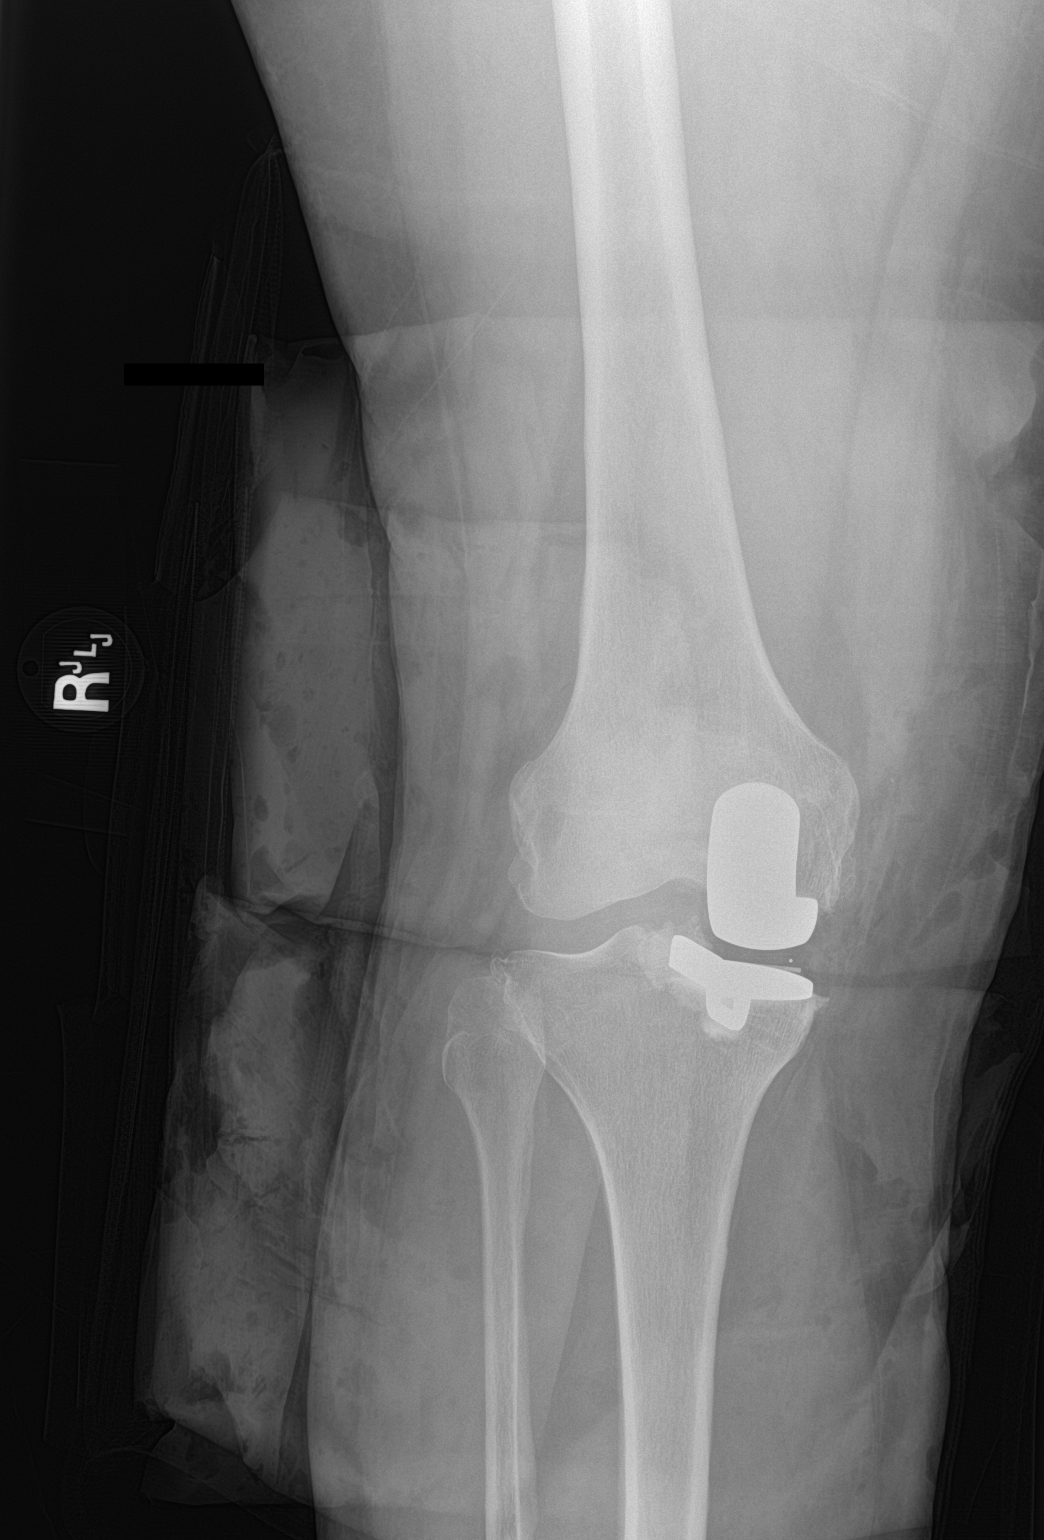

[2 of 2 positions shown; findings below may reference images not displayed]

FINDINGS: Unicompartmental joint prosthesis at medial compartment RIGHT knee.

Bones appear demineralized.

No acute fracture, dislocation, or bone destruction.

Joint fluid and air within suprapatellar recess.
IMPRESSION: Unicompartmental joint prosthesis at medial compartment RIGHT knee.

No acute abnormalities.

## 2018-08-15 ENCOUNTER — Encounter (INDEPENDENT_AMBULATORY_CARE_PROVIDER_SITE_OTHER): Payer: Self-pay | Admitting: Family Medicine

## 2018-08-15 ENCOUNTER — Ambulatory Visit (INDEPENDENT_AMBULATORY_CARE_PROVIDER_SITE_OTHER): Payer: BLUE CROSS/BLUE SHIELD | Admitting: Family Medicine

## 2018-08-15 VITALS — BP 143/88 | HR 76 | Temp 98.7°F | Resp 16 | Ht 65.0 in | Wt 214.5 lb

## 2018-08-15 DIAGNOSIS — I10 Essential (primary) hypertension: Secondary | ICD-10-CM | POA: Insufficient documentation

## 2018-08-15 DIAGNOSIS — Z0000 Encounter for general adult medical examination without abnormal findings: Secondary | ICD-10-CM | POA: Diagnosis not present

## 2018-08-15 DIAGNOSIS — K219 Gastro-esophageal reflux disease without esophagitis: Secondary | ICD-10-CM | POA: Diagnosis not present

## 2018-08-15 DIAGNOSIS — E785 Hyperlipidemia, unspecified: Secondary | ICD-10-CM

## 2018-08-15 NOTE — Patient Instructions (Signed)
   Magnesium 400 mg daily for BP

## 2018-08-15 NOTE — Progress Notes (Signed)
Office Visit Note   Patient: Connie BreedMargie Crimi           Date of Birth: 1956-07-05           MRN: [id number] Visit Date: 08/15/2018 Requested by: No referring provider defined for this encounter. PCP: Lavada MesiHilts, Marylee Belzer, MD  Subjective: Chief Complaint  Patient presents with  . Annual Exam    HPI: Her last wellness exam was 2 years ago.  Her previous PCP went out of business.  Unfortunately her husband died this past year due to pancreatic cancer.  She is coping fairly well, going to her church for grief management meetings.  She has a history of hypertension controlled with lifestyle.  She took a prescription in the past but it caused side effects.  She prefers to control health issues without prescriptions if possible.  She is asymptomatic from a blood pressure standpoint.  She tries to minimize sodium intake.  She has GERD which is worse when eating right before bed.  She has noticed a big difference taking probiotics.  She does not require any over-the-counter acid blocking medicine.  She has a history of hyperlipidemia and definitely does not want to take a statin.  She has seen lots of side effects and other people and is very afraid of that.  She does not know her family history, she was adopted.  No personal history of cardiac troubles.  She is a non-smoker.                ROS: All other systems were reviewed and are negative.    Health Maintenance: Colonoscopy: About 3 years ago, normal. Immunizations: Tetanus 2 years ago. Pap/Mammogram: Mammogram scheduled tomorrow.  Pap smear was about 2 years ago. Dentist: Every 6 months, normal. Eye: Yearly   Objective: Vital Signs: BP (!) 143/88 (BP Location: Left Arm, Patient Position: Sitting, Cuff Size: Large)   Pulse 76   Temp 98.7 F (37.1 C)   Resp 16   Ht 5\' 5"  (1.651 m)   Wt 214 lb 8 oz (97.3 kg)   BMI 35.69 kg/m   Physical Exam:  HEENT:  [place]ter/AT, PERRLA, EOM Full, no nystagmus.  Funduscopic examination within  normal limits.  No conjunctival erythema.  Tympanic membranes are pearly gray with normal landmarks.  External ear canals are normal.  Nasal passages are clear.  Oropharynx is clear.  No significant lymphadenopathy.  No thyromegaly or nodules.  2+ carotid pulses without bruits. CV: Regular rate and rhythm without murmurs, rubs, or gallops.  Trace peripheral edema.  2+ radial and posterior tibial pulses. Lungs: Clear to auscultation throughout with no wheezing or areas of consolidation. Abd: Bowel sounds are active, no hepatosplenomegaly or masses.  Soft and nontender.  No audible bruits.  No evidence of ascites. EXT: 2+ upper and lower DTRs. SKIN: No suspicious lesions. BREAST/GU:  Deferred.   Imaging: None today  Assessment & Plan: 1.  Wellness examination -Labs today.  We will discuss possible lifestyle changes to manage other issues.  2.  Hypertension -We will add magnesium 400 mg daily.  Recheck in about 6 months.  3.  GERD -Doing well with probiotics.  4.  Hyperlipidemia -Labs to evaluate.  Consider a CT calcium score.   Follow-Up Instructions: No follow-ups on file.     Procedures: None.   PMFS History: Patient Active Problem List   Diagnosis Date Noted  . Hypertension 08/15/2018  . GERD (gastroesophageal reflux disease) 08/15/2018  . Hyperlipidemia 08/15/2018  . History of  unicondylar arthroplasty of right knee 07/02/2017  . Unilateral primary osteoarthritis, right knee 05/19/2017  . Chronic pain of right knee 05/19/2017   Past Medical History:  Diagnosis Date  . Arthritis   . GERD (gastroesophageal reflux disease)   . HLD (hyperlipidemia)   . Hypertension   . Pneumonia    4 years ago     Family History  Adopted: Yes    Past Surgical History:  Procedure Laterality Date  . APPENDECTOMY     about 10 years ago   . CHOLECYSTECTOMY     4 years ago   . KNEE ARTHROSCOPY Right 02/2017  . PARTIAL KNEE ARTHROPLASTY Right 07/02/2017   Procedure: RIGHT  UNICOMPARTMENTAL KNEE ARTHROPLASTY;  Surgeon: Kathryne HitchBlackman, Christopher Y, MD;  Location: [facility];  Service: Orthopedics;  Laterality: Right;  with block  . TUBAL LIGATION     38 years ago    Social History   Occupational History  . Not on file  Tobacco Use  . Smoking status: Never Smoker  . Smokeless tobacco: Never Used  Substance and Sexual Activity  . Alcohol use: Yes    Alcohol/week: 5.0 standard drinks    Types: 5 Glasses of wine per week  . Drug use: No  . Sexual activity: Not on file

## 2018-08-16 ENCOUNTER — Telehealth (INDEPENDENT_AMBULATORY_CARE_PROVIDER_SITE_OTHER): Payer: Self-pay | Admitting: Family Medicine

## 2018-08-16 LAB — CBC WITH DIFFERENTIAL/PLATELET
Basophils Absolute: 59 cells/uL (ref 0–200)
Basophils Relative: 1 %
EOS PCT: 2.2 %
Eosinophils Absolute: 130 cells/uL (ref 15–500)
HEMATOCRIT: 41.9 % (ref 35.0–45.0)
Hemoglobin: 14.3 g/dL (ref 11.7–15.5)
LYMPHS ABS: 1593 {cells}/uL (ref 850–3900)
MCH: 31 pg (ref 27.0–33.0)
MCHC: 34.1 g/dL (ref 32.0–36.0)
MCV: 90.9 fL (ref 80.0–100.0)
MPV: 11 fL (ref 7.5–12.5)
Monocytes Relative: 8.1 %
NEUTROS PCT: 61.7 %
Neutro Abs: 3640 cells/uL (ref 1500–7800)
PLATELETS: 300 10*3/uL (ref 140–400)
RBC: 4.61 10*6/uL (ref 3.80–5.10)
RDW: 12.6 % (ref 11.0–15.0)
TOTAL LYMPHOCYTE: 27 %
WBC mixed population: 478 cells/uL (ref 200–950)
WBC: 5.9 10*3/uL (ref 3.8–10.8)

## 2018-08-16 LAB — COMPREHENSIVE METABOLIC PANEL
AG Ratio: 1.9 (calc) (ref 1.0–2.5)
ALT: 16 U/L (ref 6–29)
AST: 18 U/L (ref 10–35)
Albumin: 4.4 g/dL (ref 3.6–5.1)
Alkaline phosphatase (APISO): 70 U/L (ref 33–130)
BILIRUBIN TOTAL: 0.7 mg/dL (ref 0.2–1.2)
BUN: 13 mg/dL (ref 7–25)
CO2: 25 mmol/L (ref 20–32)
CREATININE: 0.74 mg/dL (ref 0.50–0.99)
Calcium: 9.6 mg/dL (ref 8.6–10.4)
Chloride: 105 mmol/L (ref 98–110)
Globulin: 2.3 g/dL (calc) (ref 1.9–3.7)
Glucose, Bld: 97 mg/dL (ref 65–99)
Potassium: 4.7 mmol/L (ref 3.5–5.3)
SODIUM: 141 mmol/L (ref 135–146)
TOTAL PROTEIN: 6.7 g/dL (ref 6.1–8.1)

## 2018-08-16 LAB — LIPID PANEL
Cholesterol: 210 mg/dL — ABNORMAL HIGH (ref <200)
HDL: 54 mg/dL (ref >50)
LDL CHOLESTEROL (CALC): 126 mg/dL — AB
NON-HDL CHOLESTEROL (CALC): 156 mg/dL — AB (ref <130)
TRIGLYCERIDES: 179 mg/dL — AB (ref <150)
Total CHOL/HDL Ratio: 3.9 (calc) (ref <5.0)

## 2018-08-16 LAB — HIGH SENSITIVITY CRP: hs-CRP: 4.9 mg/L — ABNORMAL HIGH

## 2018-08-16 LAB — THYROID PANEL WITH TSH
Free Thyroxine Index: 2.1 (ref 1.4–3.8)
T3 UPTAKE: 31 % (ref 22–35)
T4, Total: 6.8 ug/dL (ref 5.1–11.9)
TSH: 3.12 mIU/L (ref 0.40–4.50)

## 2018-08-16 LAB — VITAMIN D 25 HYDROXY (VIT D DEFICIENCY, FRACTURES): Vit D, 25-Hydroxy: 21 ng/mL — ABNORMAL LOW (ref 30–100)

## 2018-08-16 NOTE — Telephone Encounter (Signed)
Labs are notable for the following:  Blood glucose is still in normal range at 97, but is at the higher end of normal.  Getting close to prediabetes range.  In addition, triglycerides are 179 compared to HDL of 54.  When the triglycerides are more than double the HDL, there is a higher risk of becoming diabetic.  It is very important to maintain a regular exercise regimen and to minimize dietary intake of processed carbohydrates including breads, pastas, cereals, sugars and sweets.  We should recheck in about 6 to 12 months.  In addition, C-reactive protein is elevated at 4.9.  This is a nonspecific measure of inflammation but is considered a cardiac risk marker.  Lifestyle changes mentioned above often will help to reduce this number.  We should recheck this in about 6 to 12 months as well.  Other lipid numbers are elevated.  As we discussed, we will try to manage cardiac risk without medication.  It might be worthwhile in the future to order a CT calcium score to look for signs of plaque buildup in the heart arteries.  If this is normal, it would be very reassuring that we are doing the right thing with our management.  Other labs look good.  [facility]

## 2018-08-17 ENCOUNTER — Telehealth (INDEPENDENT_AMBULATORY_CARE_PROVIDER_SITE_OTHER): Payer: Self-pay | Admitting: Family Medicine

## 2018-08-17 NOTE — Telephone Encounter (Signed)
Mammogram was negative/normal.  [facility]

## 2019-01-17 ENCOUNTER — Telehealth (INDEPENDENT_AMBULATORY_CARE_PROVIDER_SITE_OTHER): Payer: Self-pay | Admitting: Orthopaedic Surgery

## 2019-01-17 NOTE — Telephone Encounter (Signed)
Returned call to patient left message to call back. 

## 2019-01-18 ENCOUNTER — Encounter (INDEPENDENT_AMBULATORY_CARE_PROVIDER_SITE_OTHER): Payer: Self-pay | Admitting: Physician Assistant

## 2019-01-18 ENCOUNTER — Ambulatory Visit (INDEPENDENT_AMBULATORY_CARE_PROVIDER_SITE_OTHER): Payer: BLUE CROSS/BLUE SHIELD | Admitting: Physician Assistant

## 2019-01-18 ENCOUNTER — Ambulatory Visit (INDEPENDENT_AMBULATORY_CARE_PROVIDER_SITE_OTHER): Payer: Self-pay

## 2019-01-18 DIAGNOSIS — M1712 Unilateral primary osteoarthritis, left knee: Secondary | ICD-10-CM | POA: Diagnosis not present

## 2019-01-18 NOTE — Progress Notes (Signed)
Office Visit Note   Patient: [name]           Date of Birth: 04-18-1956           MRN: [id number] Visit Date: 01/18/2019              Requested by: Lavada MesiHilts, Michael, MD [address] [place], [place] [address] PCP: Lavada MesiHilts, Michael, MD   Assessment & Plan: Visit Diagnoses:  1. Primary osteoarthritis of left knee     Plan: We will try to gain approval for supplemental injection of the left knee.  Have her follow-up once this is available.  Should continue to work on quad strengthening both knees.  Questions encouraged and answered at length. Follow-Up Instructions: No follow-ups on file.   Orders:  Orders Placed This Encounter  Procedures  . XR Knee 1-2 Views Left   No orders of the defined types were placed in this encounter.     Procedures: No procedures performed   Clinical Data: No additional findings.   Subjective: Chief Complaint  Patient presents with  . Left Knee - Pain    HPI [name] comes in today for left knee pain.  She is status post a right knee uni-arthroplasty 2018 which is doing well.  She states that her husband passed away recently and now she lives alone.  She is starting to have more more pain in the left knee no known injury.  Would like to get something done before her knee gets "too bad".  No mechanical symptoms left knee.  Knee pain is worse with prolonged sitting. Review of Systems Please see HPI otherwise negative or noncontributory  Objective: Vital Signs: There were no vitals taken for this visit.  Physical Exam Constitutional:      Appearance: She is not ill-appearing or diaphoretic.  Pulmonary:     Effort: Pulmonary effort is normal.  Neurological:     Mental Status: She is alert and oriented to person, place, and time.  Psychiatric:        Mood and Affect: Mood normal.     Ortho Exam Right knee good range of motion without pain.  Well-healed surgical incision.  Nontender medial lateral joint line.  Left  knee tenderness anterior medial and medial joint line.  No instability valgus varus stressing.  Positive patellofemoral crepitus left knee with range of motion.  Good range of motion no abnormal warmth erythema or effusion of either knee. Specialty Comments:  No specialty comments available.  Imaging: Xr Knee 1-2 Views Left  Result Date: 01/18/2019 Left knee: 3 views no acute fracture.  Near bone-on-bone medial compartment significant periarticular spurring lateral aspect of the knee.  Moderate to severe patellofemoral changes.    PMFS History: Patient Active Problem List   Diagnosis Date Noted  . Hypertension 08/15/2018  . GERD (gastroesophageal reflux disease) 08/15/2018  . Hyperlipidemia 08/15/2018  . History of unicondylar arthroplasty of right knee 07/02/2017  . Unilateral primary osteoarthritis, right knee 05/19/2017  . Chronic pain of right knee 05/19/2017   Past Medical History:  Diagnosis Date  . Arthritis   . GERD (gastroesophageal reflux disease)   . HLD (hyperlipidemia)   . Hypertension   . Pneumonia    4 years ago     Family History  Adopted: Yes    Past Surgical History:  Procedure Laterality Date  . APPENDECTOMY     about 10 years ago   . CHOLECYSTECTOMY     4  years ago   . KNEE ARTHROSCOPY Right 02/2017  . PARTIAL KNEE ARTHROPLASTY Right 07/02/2017   Procedure: RIGHT UNICOMPARTMENTAL KNEE ARTHROPLASTY;  Surgeon: Kathryne Hitch, MD;  Location: [facility];  Service: Orthopedics;  Laterality: Right;  with block  . TUBAL LIGATION     38 years ago    Social History   Occupational History  . Not on file  Tobacco Use  . Smoking status: Never Smoker  . Smokeless tobacco: Never Used  Substance and Sexual Activity  . Alcohol use: Yes    Alcohol/week: 5.0 standard drinks    Types: 5 Glasses of wine per week  . Drug use: No  . Sexual activity: Not on file

## 2019-01-19 ENCOUNTER — Telehealth (INDEPENDENT_AMBULATORY_CARE_PROVIDER_SITE_OTHER): Payer: Self-pay

## 2019-01-19 NOTE — Telephone Encounter (Signed)
Left knee gel injection ?

## 2019-01-19 NOTE — Telephone Encounter (Signed)
Noted  

## 2019-01-25 ENCOUNTER — Encounter (INDEPENDENT_AMBULATORY_CARE_PROVIDER_SITE_OTHER): Payer: Self-pay

## 2019-02-01 ENCOUNTER — Encounter (INDEPENDENT_AMBULATORY_CARE_PROVIDER_SITE_OTHER): Payer: Self-pay

## 2019-02-01 ENCOUNTER — Telehealth (INDEPENDENT_AMBULATORY_CARE_PROVIDER_SITE_OTHER): Payer: Self-pay

## 2019-02-01 NOTE — Telephone Encounter (Signed)
Submitted VOB for SynviscOne, Left knee. 

## 2019-02-06 ENCOUNTER — Encounter (INDEPENDENT_AMBULATORY_CARE_PROVIDER_SITE_OTHER): Payer: Self-pay

## 2019-02-06 ENCOUNTER — Telehealth (INDEPENDENT_AMBULATORY_CARE_PROVIDER_SITE_OTHER): Payer: Self-pay

## 2019-02-06 NOTE — Telephone Encounter (Signed)
Received VOB for SynviscOne, left knee. PA required for SynviscOne, left knee. Faxed PA form to [facility] at [phone].

## 2019-02-07 ENCOUNTER — Telehealth (INDEPENDENT_AMBULATORY_CARE_PROVIDER_SITE_OTHER): Payer: Self-pay

## 2019-02-07 NOTE — Telephone Encounter (Signed)
Talked with patient and advised her that she is approved for gel injection.  Patient stated that she has an appointment with Dr. Magnus Ivan on Thursday, 02/09/2019 to discuss surgery.   Approved for SynviscOne, left knee. Buy & Bill Covered at 100% of the allowed amount No Co-pay PA required PA Approval# [id number] Valid 02/06/2019- 02/06/2020

## 2019-02-09 ENCOUNTER — Encounter (INDEPENDENT_AMBULATORY_CARE_PROVIDER_SITE_OTHER): Payer: Self-pay | Admitting: Orthopaedic Surgery

## 2019-02-09 ENCOUNTER — Ambulatory Visit (INDEPENDENT_AMBULATORY_CARE_PROVIDER_SITE_OTHER): Payer: BLUE CROSS/BLUE SHIELD | Admitting: Orthopaedic Surgery

## 2019-02-09 DIAGNOSIS — M1712 Unilateral primary osteoarthritis, left knee: Secondary | ICD-10-CM | POA: Diagnosis not present

## 2019-02-09 NOTE — Progress Notes (Signed)
Office Visit Note   Patient: [name]           Date of Birth: 07/24/1956           MRN: [id number] Visit Date: 02/09/2019              Requested by: Lavada MesiHilts, Michael, MD [address] [place], [place] [address] PCP: Lavada MesiHilts, Michael, MD   Assessment & Plan: Visit Diagnoses:  1. Unilateral primary osteoarthritis, left knee     Plan: Based on her clinical exam findings and x-ray findings I agree with the need for knee replacement at this point.  She tried and failed all forms of conservative treatment on her left knee and a left total knee replacement is warranted.  She understands that the right partial knee was better for that side but total is better for this side.  She absolutely gets it.  We talked about surgery and the risk and benefits involved.  We had a long thorough discussion about surgery in detail and what to expect.  We talked about her intraoperative and postoperative course.  All question concerns were  answered and addressed.  We will work on getting this scheduled.  Follow-Up Instructions: Return for 2 weeks post-op.   Orders:  No orders of the defined types were placed in  this encounter.  No orders of the defined types were placed in this encounter.     Procedures: No procedures performed   Clinical Data: No additional findings.   Subjective: Chief Complaint  Patient presents with  . Left Knee - Pain  The patient is well-known to me.  She has severe end-stage arthritis of her left knee.  She actually comes in today to talk about knee replacement surgery.  Her left knee is hurting much more.  She has been having locking and catching.  We tried a steroid injection and this did not help her at all and things are getting worse.  She is now using a cane to mobilize and ambulate.  She has had a previous right partial knee replacement but she understands her recommending a total knee for the left side.  At this point her pain is daily and she is waking up and going to bed with pain.  It is detrimentally affecting her mobility, her quality of life and her activities daily living to the point that she does wish proceed with a knee replacement on her left knee.  She is tried and failed all forms of conservative treatment for over 12 months now.  HPI  Review of Systems She currently denies any headache, chest pain, shortness of breath, fever, chills, nausea, vomiting  Objective: Vital Signs: There were no vitals taken for this visit.  Physical Exam She is alert and orient x3 and in no acute distress Ortho Exam Examination of left knee does show that has a mild effusion.  She has medial and lateral joint line tenderness as well as patellofemoral crepitation.  She has varus malalignment that is correctable.  She lacks full extension by just a few degrees and she has good flexion.  The knee feels ligamentously stable. Specialty Comments:  No specialty comments available.  Imaging: No results found. X-rays from last month reviewed again of the left knee shows varus malalignment.  There is complete loss the medial joint space.  There are peri-articular  osteophytes in all 3 compartments with also patellofemoral disease.  PMFS History: Patient Active Problem List   Diagnosis Date Noted  . Unilateral primary osteoarthritis, left knee 02/09/2019  . Hypertension 08/15/2018  . GERD (gastroesophageal reflux disease) 08/15/2018  . Hyperlipidemia 08/15/2018  . History of unicondylar arthroplasty of right knee 07/02/2017  . Unilateral primary osteoarthritis, right knee 05/19/2017  . Chronic pain of right knee 05/19/2017   Past Medical History:  Diagnosis Date  . Arthritis   . GERD (gastroesophageal reflux disease)   . HLD (hyperlipidemia)   . Hypertension   . Pneumonia    4 years ago     Family History  Adopted: Yes    Past Surgical History:  Procedure Laterality Date  . APPENDECTOMY     about 10 years ago   . CHOLECYSTECTOMY     4 years ago   . KNEE ARTHROSCOPY Right 02/2017  . PARTIAL KNEE ARTHROPLASTY Right 07/02/2017   Procedure: RIGHT UNICOMPARTMENTAL KNEE ARTHROPLASTY;  Surgeon: Kathryne Hitch, MD;  Location: [facility];  Service: Orthopedics;  Laterality: Right;  with block  . TUBAL LIGATION     38 years ago    Social History   Occupational History  . Not on file  Tobacco Use  .  Smoking status: Never Smoker  . Smokeless tobacco: Never Used  Substance and Sexual Activity  . Alcohol use: Yes    Alcohol/week: 5.0 standard drinks    Types: 5 Glasses of wine per week  . Drug use: No  . Sexual activity: Not on file

## 2019-02-15 ENCOUNTER — Encounter (INDEPENDENT_AMBULATORY_CARE_PROVIDER_SITE_OTHER): Payer: Self-pay | Admitting: Orthopaedic Surgery

## 2019-02-28 ENCOUNTER — Other Ambulatory Visit (INDEPENDENT_AMBULATORY_CARE_PROVIDER_SITE_OTHER): Payer: Self-pay | Admitting: Physician Assistant

## 2019-03-03 ENCOUNTER — Telehealth (INDEPENDENT_AMBULATORY_CARE_PROVIDER_SITE_OTHER): Payer: Self-pay

## 2019-03-03 NOTE — Telephone Encounter (Signed)
Case manager with [employer] called to get a correct phone # for pt  So they can speak with her regarding preop and post op surgery needs.# they had was the old work # for pt [phone]. Gave her home # and cell #.

## 2019-03-03 NOTE — Patient Instructions (Signed)
[name]  03/03/2019   Your procedure is scheduled on: 03-10-2019   Report to [facility] Main  Entrance     Report to admitting at 6:00AM    Call this number if you have problems the morning of surgery [phone]      Remember: Do not eat food or drink liquids :After Midnight. BRUSH YOUR TEETH MORNING OF SURGERY AND RINSE YOUR MOUTH OUT, NO CHEWING GUM CANDY OR MINTS.     Take these medicines the morning of surgery with A SIP OF WATER: none                                You may not have any metal on your body including hair pins and              piercings  Do not wear jewelry, make-up, lotions, powders or perfumes, deodorant             Do not wear nail polish.  Do not shave  48 hours prior to surgery.                 Do not bring valuables to the hospital. [facility] IS NOT             RESPONSIBLE   FOR VALUABLES.  Contacts, dentures or bridgework may not be worn into surgery.  Leave suitcase in the car. After surgery it may be brought to your room.                   Please read over the following fact sheets you were given: _____________________________________________________________________             [facility] - Preparing for Surgery Before surgery, you can play an important role.  Because skin is not sterile, your skin needs to be as free of germs as possible.  You can reduce the number of germs on your skin by washing with CHG (chlorahexidine gluconate) soap before surgery.  CHG is an antiseptic cleaner which kills germs and bonds with the skin to continue killing germs even after washing. Please DO NOT use if you have an allergy to CHG or antibacterial soaps.  If your skin becomes reddened/irritated stop using the CHG and inform your nurse when you arrive at Short Stay. Do not shave (including legs and underarms) for at least 48 hours prior to the first CHG shower.  You may shave your face/neck. Please follow these  instructions carefully:  1.  Shower with CHG Soap the night before surgery and the  morning of Surgery.  2.  If you choose to wash your hair, wash your hair first as usual with your  normal  shampoo.  3.  After you shampoo, rinse your hair and body thoroughly to remove the  shampoo.                           4.  Use CHG as you would any other liquid soap.  You can apply chg directly  to the skin and wash                       Gently with a scrungie or clean washcloth.  5.  Apply the CHG Soap to your body ONLY FROM THE  NECK DOWN.   Do not use on face/ open                           Wound or open sores. Avoid contact with eyes, ears mouth and genitals (private parts).                       Wash face,  Genitals (private parts) with your normal soap.             6.  Wash thoroughly, paying special attention to the area where your surgery  will be performed.  7.  Thoroughly rinse your body with warm water from the neck down.  8.  DO NOT shower/wash with your normal soap after using and rinsing off  the CHG Soap.                9.  Pat yourself dry with a clean towel.            10.  Wear clean pajamas.            11.  Place clean sheets on your bed the night of your first shower and do not  sleep with pets. Day of Surgery : Do not apply any lotions/deodorants the morning of surgery.  Please wear clean clothes to the hospital/surgery center.  FAILURE TO FOLLOW THESE INSTRUCTIONS MAY RESULT IN THE CANCELLATION OF YOUR SURGERY PATIENT SIGNATURE_________________________________  NURSE SIGNATURE__________________________________  ________________________________________________________________________   Adam Phenix  An incentive spirometer is a tool that can help keep your lungs clear and active. This tool measures how well you are filling your lungs with each breath. Taking long deep breaths may help reverse or decrease the chance of developing breathing (pulmonary) problems (especially  infection) following:  A long period of time when you are unable to move or be active. BEFORE THE PROCEDURE   If the spirometer includes an indicator to show your best effort, your nurse or respiratory therapist will set it to a desired goal.  If possible, sit up straight or lean slightly forward. Try not to slouch.  Hold the incentive spirometer in an upright position. INSTRUCTIONS FOR USE  1. Sit on the edge of your bed if possible, or sit up as far as you can in bed or on a chair. 2. Hold the incentive spirometer in an upright position. 3. Breathe out normally. 4. Place the mouthpiece in your mouth and seal your lips tightly around it. 5. Breathe in slowly and as deeply as possible, raising the piston or the ball toward the top of the column. 6. Hold your breath for 3-5 seconds or for as long as possible. Allow the piston or ball to fall to the bottom of the column. 7. Remove the mouthpiece from your mouth and breathe out normally. 8. Rest for a few seconds and repeat Steps 1 through 7 at least 10 times every 1-2 hours when you are awake. Take your time and take a few normal breaths between deep breaths. 9. The spirometer may include an indicator to show your best effort. Use the indicator as a goal to work toward during each repetition. 10. After each set of 10 deep breaths, practice coughing to be sure your lungs are clear. If you have an incision (the cut made at the time of surgery), support your incision when coughing by placing a pillow or rolled up towels firmly against it. Once you are able  to get out of bed, walk around indoors and cough well. You may stop using the incentive spirometer when instructed by your caregiver.  RISKS AND COMPLICATIONS  Take your time so you do not get dizzy or light-headed.  If you are in pain, you may need to take or ask for pain medication before doing incentive spirometry. It is harder to take a deep breath if you are having pain. AFTER  USE  Rest and breathe slowly and easily.  It can be helpful to keep track of a log of your progress. Your caregiver can provide you with a simple table to help with this. If you are using the spirometer at home, follow these instructions: SEEK MEDICAL CARE IF:   You are having difficultly using the spirometer.  You have trouble using the spirometer as often as instructed.  Your pain medication is not giving enough relief while using the spirometer.  You develop fever of 100.5 F (38.1 C) or higher. SEEK IMMEDIATE MEDICAL CARE IF:   You cough up bloody sputum that had not been present before.  You develop fever of 102 F (38.9 C) or greater.  You develop worsening pain at or near the incision site. MAKE SURE YOU:   Understand these instructions.  Will watch your condition.  Will get help right away if you are not doing well or get worse. Document Released: 04/26/2007 Document Revised: 03/07/2012 Document Reviewed: 06/27/2007 [facility] Patient Information 2014 [place], [place].   ________________________________________________________________________

## 2019-03-06 ENCOUNTER — Other Ambulatory Visit: Payer: Self-pay

## 2019-03-06 ENCOUNTER — Encounter (HOSPITAL_COMMUNITY)
Admission: RE | Admit: 2019-03-06 | Discharge: 2019-03-06 | Disposition: A | Discharge status: Home / Self Care | Payer: BLUE CROSS/BLUE SHIELD | Source: Ambulatory Visit | Attending: Orthopaedic Surgery | Admitting: Orthopaedic Surgery

## 2019-03-06 ENCOUNTER — Encounter (HOSPITAL_COMMUNITY): Payer: Self-pay

## 2019-03-06 DIAGNOSIS — I10 Essential (primary) hypertension: Secondary | ICD-10-CM | POA: Diagnosis not present

## 2019-03-06 DIAGNOSIS — M1712 Unilateral primary osteoarthritis, left knee: Secondary | ICD-10-CM | POA: Diagnosis not present

## 2019-03-06 DIAGNOSIS — R9431 Abnormal electrocardiogram [ECG] [EKG]: Secondary | ICD-10-CM | POA: Diagnosis not present

## 2019-03-06 DIAGNOSIS — Z01818 Encounter for other preprocedural examination: Secondary | ICD-10-CM | POA: Diagnosis present

## 2019-03-06 LAB — CBC
HEMATOCRIT: 44.8 % (ref 36.0–46.0)
Hemoglobin: 14 g/dL (ref 12.0–15.0)
MCH: 30.5 pg (ref 26.0–34.0)
MCHC: 31.3 g/dL (ref 30.0–36.0)
MCV: 97.6 fL (ref 80.0–100.0)
PLATELETS: 301 10*3/uL (ref 150–400)
RBC: 4.59 MIL/uL (ref 3.87–5.11)
RDW: 12.1 % (ref 11.5–15.5)
WBC: 8 10*3/uL (ref 4.0–10.5)
nRBC: 0 % (ref 0.0–0.2)

## 2019-03-06 LAB — BASIC METABOLIC PANEL
Anion gap: 9 (ref 5–15)
BUN: 8 mg/dL (ref 8–23)
CALCIUM: 9.7 mg/dL (ref 8.9–10.3)
CHLORIDE: 106 mmol/L (ref 98–111)
CO2: 27 mmol/L (ref 22–32)
CREATININE: 0.65 mg/dL (ref 0.44–1.00)
GFR calc non Af Amer: >60 mL/min (ref >60)
Glucose, Bld: 103 mg/dL — ABNORMAL HIGH (ref 70–99)
Potassium: 4.7 mmol/L (ref 3.5–5.1)
SODIUM: 142 mmol/L (ref 135–145)

## 2019-03-06 LAB — SURGICAL PCR SCREEN
MRSA, PCR: NEGATIVE
Staphylococcus aureus: NEGATIVE

## 2019-03-10 ENCOUNTER — Inpatient Hospital Stay (HOSPITAL_COMMUNITY): Payer: BLUE CROSS/BLUE SHIELD

## 2019-03-10 ENCOUNTER — Encounter (HOSPITAL_COMMUNITY)
Admission: RE | Disposition: A | Discharge status: Home Health | Payer: Self-pay | Source: Home / Self Care | Attending: Orthopaedic Surgery

## 2019-03-10 ENCOUNTER — Inpatient Hospital Stay (HOSPITAL_COMMUNITY): Payer: BLUE CROSS/BLUE SHIELD | Admitting: Physician Assistant

## 2019-03-10 ENCOUNTER — Inpatient Hospital Stay (HOSPITAL_COMMUNITY): Payer: BLUE CROSS/BLUE SHIELD | Admitting: Certified Registered"

## 2019-03-10 ENCOUNTER — Other Ambulatory Visit: Payer: Self-pay

## 2019-03-10 ENCOUNTER — Encounter (HOSPITAL_COMMUNITY): Payer: Self-pay | Admitting: Certified Registered"

## 2019-03-10 ENCOUNTER — Inpatient Hospital Stay (HOSPITAL_COMMUNITY)
Admission: RE | Admit: 2019-03-10 | Discharge: 2019-03-12 | DRG: 470 | Disposition: A | Discharge status: Home Health | Payer: BLUE CROSS/BLUE SHIELD | Attending: Orthopaedic Surgery | Admitting: Orthopaedic Surgery

## 2019-03-10 DIAGNOSIS — M1712 Unilateral primary osteoarthritis, left knee: Principal | ICD-10-CM

## 2019-03-10 DIAGNOSIS — I10 Essential (primary) hypertension: Secondary | ICD-10-CM | POA: Diagnosis present

## 2019-03-10 DIAGNOSIS — Z96651 Presence of right artificial knee joint: Secondary | ICD-10-CM | POA: Diagnosis present

## 2019-03-10 DIAGNOSIS — Z9049 Acquired absence of other specified parts of digestive tract: Secondary | ICD-10-CM

## 2019-03-10 DIAGNOSIS — Z888 Allergy status to other drugs, medicaments and biological substances status: Secondary | ICD-10-CM | POA: Diagnosis not present

## 2019-03-10 DIAGNOSIS — E785 Hyperlipidemia, unspecified: Secondary | ICD-10-CM | POA: Diagnosis present

## 2019-03-10 DIAGNOSIS — K219 Gastro-esophageal reflux disease without esophagitis: Secondary | ICD-10-CM | POA: Diagnosis present

## 2019-03-10 DIAGNOSIS — Z96652 Presence of left artificial knee joint: Secondary | ICD-10-CM

## 2019-03-10 HISTORY — PX: TOTAL KNEE ARTHROPLASTY: SHX125

## 2019-03-10 SURGERY — ARTHROPLASTY, KNEE, TOTAL
Anesthesia: Spinal | Site: Knee | Laterality: Left

## 2019-03-10 MED ORDER — ONDANSETRON HCL 4 MG/2ML IJ SOLN
INTRAMUSCULAR | Status: AC
  Filled 2019-03-10: qty 2

## 2019-03-10 MED ORDER — LACTATED RINGERS IV SOLN
INTRAVENOUS | Status: DC
  Administered 2019-03-10 (×3): via INTRAVENOUS

## 2019-03-10 MED ORDER — PROPOFOL 10 MG/ML IV BOLUS
INTRAVENOUS | Status: AC
  Filled 2019-03-10: qty 40

## 2019-03-10 MED ORDER — DEXAMETHASONE SODIUM PHOSPHATE 10 MG/ML IJ SOLN
INTRAMUSCULAR | Status: DC | PRN
  Administered 2019-03-10: 10 mg via INTRAVENOUS

## 2019-03-10 MED ORDER — FENTANYL CITRATE (PF) 100 MCG/2ML IJ SOLN
50.0000 ug | Freq: Once | INTRAMUSCULAR | Status: AC
  Administered 2019-03-10: 50 ug via INTRAVENOUS

## 2019-03-10 MED ORDER — MIDAZOLAM HCL 2 MG/2ML IJ SOLN
1.0000 mg | Freq: Once | INTRAMUSCULAR | Status: AC
  Administered 2019-03-10: 1 mg via INTRAVENOUS

## 2019-03-10 MED ORDER — HYDROMORPHONE HCL 1 MG/ML IJ SOLN
INTRAMUSCULAR | Status: AC
  Filled 2019-03-10: qty 1

## 2019-03-10 MED ORDER — METHOCARBAMOL 500 MG IVPB - SIMPLE MED
500.0000 mg | Freq: Four times a day (QID) | INTRAVENOUS | Status: DC | PRN
  Administered 2019-03-10: 500 mg via INTRAVENOUS
  Filled 2019-03-10: qty 50

## 2019-03-10 MED ORDER — FENTANYL CITRATE (PF) 100 MCG/2ML IJ SOLN
INTRAMUSCULAR | Status: AC
  Administered 2019-03-10: 50 ug via INTRAVENOUS
  Filled 2019-03-10: qty 2

## 2019-03-10 MED ORDER — SODIUM CHLORIDE 0.9 % IV SOLN
INTRAVENOUS | Status: DC | PRN
  Administered 2019-03-10: 20 ug/min via INTRAVENOUS

## 2019-03-10 MED ORDER — CEFAZOLIN SODIUM-DEXTROSE 2-4 GM/100ML-% IV SOLN
2.0000 g | INTRAVENOUS | Status: AC
  Administered 2019-03-10: 2 g via INTRAVENOUS
  Filled 2019-03-10: qty 100

## 2019-03-10 MED ORDER — PROPOFOL 500 MG/50ML IV EMUL
INTRAVENOUS | Status: DC | PRN
  Administered 2019-03-10: 100 ug/kg/min via INTRAVENOUS

## 2019-03-10 MED ORDER — ACETAMINOPHEN 325 MG PO TABS
325.0000 mg | ORAL_TABLET | Freq: Four times a day (QID) | ORAL | Status: DC | PRN
  Administered 2019-03-12: 650 mg via ORAL
  Filled 2019-03-10: qty 2

## 2019-03-10 MED ORDER — ASPIRIN 81 MG PO CHEW
81.0000 mg | CHEWABLE_TABLET | Freq: Two times a day (BID) | ORAL | Status: DC
  Administered 2019-03-10 – 2019-03-12 (×4): 81 mg via ORAL
  Filled 2019-03-10 (×4): qty 1

## 2019-03-10 MED ORDER — DEXAMETHASONE SODIUM PHOSPHATE 10 MG/ML IJ SOLN
INTRAMUSCULAR | Status: AC
  Filled 2019-03-10: qty 1

## 2019-03-10 MED ORDER — STERILE WATER FOR IRRIGATION IR SOLN
Status: DC | PRN
  Administered 2019-03-10 (×2): 1000 mL

## 2019-03-10 MED ORDER — PHENOL 1.4 % MT LIQD: 1.0000 | OROMUCOSAL | Status: DC | PRN

## 2019-03-10 MED ORDER — DIPHENHYDRAMINE HCL 12.5 MG/5ML PO ELIX: 12.5000 mg | ORAL_SOLUTION | ORAL | Status: DC | PRN

## 2019-03-10 MED ORDER — METOCLOPRAMIDE HCL 5 MG PO TABS: 5.0000 mg | ORAL_TABLET | Freq: Three times a day (TID) | ORAL | Status: DC | PRN

## 2019-03-10 MED ORDER — ONDANSETRON HCL 4 MG/2ML IJ SOLN
INTRAMUSCULAR | Status: DC | PRN
  Administered 2019-03-10: 4 mg via INTRAVENOUS

## 2019-03-10 MED ORDER — FLORAJEN ACIDOPHILUS PO CAPS: 1.0000 | ORAL_CAPSULE | Freq: Every day | ORAL | Status: DC

## 2019-03-10 MED ORDER — METHOCARBAMOL 500 MG PO TABS
500.0000 mg | ORAL_TABLET | Freq: Four times a day (QID) | ORAL | Status: DC | PRN
  Administered 2019-03-10 – 2019-03-11 (×3): 500 mg via ORAL
  Filled 2019-03-10 (×3): qty 1

## 2019-03-10 MED ORDER — BUPIVACAINE-EPINEPHRINE (PF) 0.25% -1:200000 IJ SOLN
INTRAMUSCULAR | Status: AC
  Filled 2019-03-10: qty 30

## 2019-03-10 MED ORDER — METOCLOPRAMIDE HCL 5 MG/ML IJ SOLN: 5.0000 mg | Freq: Three times a day (TID) | INTRAMUSCULAR | Status: DC | PRN

## 2019-03-10 MED ORDER — BUPIVACAINE-EPINEPHRINE (PF) 0.25% -1:200000 IJ SOLN
INTRAMUSCULAR | Status: DC | PRN
  Administered 2019-03-10: 30 mL

## 2019-03-10 MED ORDER — MENTHOL 3 MG MT LOZG: 1.0000 | LOZENGE | OROMUCOSAL | Status: DC | PRN

## 2019-03-10 MED ORDER — PHENYLEPHRINE HCL 10 MG/ML IJ SOLN
INTRAMUSCULAR | Status: AC
  Filled 2019-03-10: qty 1

## 2019-03-10 MED ORDER — OXYCODONE HCL 5 MG PO TABS
10.0000 mg | ORAL_TABLET | ORAL | Status: DC | PRN
  Administered 2019-03-10 – 2019-03-11 (×4): 10 mg via ORAL
  Filled 2019-03-10 (×2): qty 2

## 2019-03-10 MED ORDER — BUPIVACAINE IN DEXTROSE 0.75-8.25 % IT SOLN
INTRATHECAL | Status: DC | PRN
  Administered 2019-03-10: 1.6 mL via INTRATHECAL

## 2019-03-10 MED ORDER — GABAPENTIN 100 MG PO CAPS
100.0000 mg | ORAL_CAPSULE | Freq: Three times a day (TID) | ORAL | Status: DC
  Administered 2019-03-10 – 2019-03-12 (×5): 100 mg via ORAL
  Filled 2019-03-10 (×5): qty 1

## 2019-03-10 MED ORDER — METHOCARBAMOL 500 MG IVPB - SIMPLE MED
INTRAVENOUS | Status: AC
  Filled 2019-03-10: qty 50

## 2019-03-10 MED ORDER — CHLORHEXIDINE GLUCONATE 4 % EX LIQD: 60.0000 mL | Freq: Once | CUTANEOUS | Status: DC

## 2019-03-10 MED ORDER — SACCHAROMYCES BOULARDII 250 MG PO CAPS
250.0000 mg | ORAL_CAPSULE | Freq: Every day | ORAL | Status: DC
  Administered 2019-03-11 – 2019-03-12 (×2): 250 mg via ORAL
  Filled 2019-03-10 (×2): qty 1

## 2019-03-10 MED ORDER — CEFAZOLIN SODIUM-DEXTROSE 1-4 GM/50ML-% IV SOLN
1.0000 g | Freq: Four times a day (QID) | INTRAVENOUS | Status: AC
  Administered 2019-03-10 (×2): 1 g via INTRAVENOUS
  Filled 2019-03-10 (×2): qty 50

## 2019-03-10 MED ORDER — CLONIDINE HCL (ANALGESIA) 100 MCG/ML EP SOLN
EPIDURAL | Status: DC | PRN
  Administered 2019-03-10: 70 ug

## 2019-03-10 MED ORDER — ONDANSETRON HCL 4 MG/2ML IJ SOLN
4.0000 mg | Freq: Four times a day (QID) | INTRAMUSCULAR | Status: DC | PRN
  Administered 2019-03-11: 4 mg via INTRAVENOUS
  Filled 2019-03-10: qty 2

## 2019-03-10 MED ORDER — HYDROMORPHONE HCL 1 MG/ML IJ SOLN
0.2500 mg | INTRAMUSCULAR | Status: DC | PRN
  Administered 2019-03-10 (×4): 0.5 mg via INTRAVENOUS

## 2019-03-10 MED ORDER — ALUM & MAG HYDROXIDE-SIMETH 200-200-20 MG/5ML PO SUSP: 30.0000 mL | ORAL | Status: DC | PRN

## 2019-03-10 MED ORDER — HYDROMORPHONE HCL 1 MG/ML IJ SOLN
0.5000 mg | INTRAMUSCULAR | Status: DC | PRN
  Administered 2019-03-10: 1 mg via INTRAVENOUS
  Administered 2019-03-10: 0.5 mg via INTRAVENOUS
  Filled 2019-03-10 (×2): qty 1

## 2019-03-10 MED ORDER — ONDANSETRON HCL 4 MG PO TABS
4.0000 mg | ORAL_TABLET | Freq: Four times a day (QID) | ORAL | Status: DC | PRN
  Administered 2019-03-12 (×2): 4 mg via ORAL
  Filled 2019-03-10 (×2): qty 1

## 2019-03-10 MED ORDER — PANTOPRAZOLE SODIUM 40 MG PO TBEC
40.0000 mg | DELAYED_RELEASE_TABLET | Freq: Every day | ORAL | Status: DC
  Administered 2019-03-11 – 2019-03-12 (×2): 40 mg via ORAL
  Filled 2019-03-10 (×2): qty 1

## 2019-03-10 MED ORDER — TRANEXAMIC ACID-NACL 1000-0.7 MG/100ML-% IV SOLN
1000.0000 mg | INTRAVENOUS | Status: AC
  Administered 2019-03-10: 1000 mg via INTRAVENOUS
  Filled 2019-03-10: qty 100

## 2019-03-10 MED ORDER — OXYCODONE HCL 5 MG PO TABS
5.0000 mg | ORAL_TABLET | ORAL | Status: DC | PRN
  Administered 2019-03-10: 5 mg via ORAL
  Administered 2019-03-10 – 2019-03-12 (×4): 10 mg via ORAL
  Filled 2019-03-10 (×6): qty 2
  Filled 2019-03-10: qty 1

## 2019-03-10 MED ORDER — DOCUSATE SODIUM 100 MG PO CAPS
100.0000 mg | ORAL_CAPSULE | Freq: Two times a day (BID) | ORAL | Status: DC
  Administered 2019-03-10 – 2019-03-12 (×4): 100 mg via ORAL
  Filled 2019-03-10 (×4): qty 1

## 2019-03-10 MED ORDER — MIDAZOLAM HCL 2 MG/2ML IJ SOLN
INTRAMUSCULAR | Status: AC
  Administered 2019-03-10: 1 mg via INTRAVENOUS
  Filled 2019-03-10: qty 2

## 2019-03-10 MED ORDER — ADULT MULTIVITAMIN W/MINERALS CH
1.0000 | ORAL_TABLET | Freq: Every day | ORAL | Status: DC
  Administered 2019-03-11 – 2019-03-12 (×2): 1 via ORAL
  Filled 2019-03-10 (×3): qty 1

## 2019-03-10 MED ORDER — POLYETHYLENE GLYCOL 3350 17 G PO PACK: 17.0000 g | PACK | Freq: Every day | ORAL | Status: DC | PRN

## 2019-03-10 MED ORDER — PROMETHAZINE HCL 25 MG/ML IJ SOLN: 6.2500 mg | INTRAMUSCULAR | Status: DC | PRN

## 2019-03-10 MED ORDER — BUPIVACAINE HCL (PF) 0.5 % IJ SOLN
INTRAMUSCULAR | Status: DC | PRN
  Administered 2019-03-10: 20 mL via PERINEURAL

## 2019-03-10 MED ORDER — SODIUM CHLORIDE 0.9 % IV SOLN
INTRAVENOUS | Status: DC
  Administered 2019-03-10 – 2019-03-11 (×2): via INTRAVENOUS

## 2019-03-10 SURGICAL SUPPLY — 61 items
APL PRP STRL LF DISP 70% ISPRP (MISCELLANEOUS) ×1
APL SKNCLS STERI-STRIP NONHPOA (GAUZE/BANDAGES/DRESSINGS)
BAG SPEC THK2 15X12 ZIP CLS (MISCELLANEOUS)
BAG ZIPLOCK 12X15 (MISCELLANEOUS) IMPLANT
BANDAGE ACE 6X5 VEL STRL LF (GAUZE/BANDAGES/DRESSINGS) ×2 IMPLANT
BEARIN INSERT TIBIAL 11 (Orthopedic Implant) IMPLANT
BEARING INSERT TIBIAL 11 (Orthopedic Implant) IMPLANT
BENZOIN TINCTURE PRP APPL 2/3 (GAUZE/BANDAGES/DRESSINGS) IMPLANT
BLADE SAG 18X100X1.27 (BLADE) ×1 IMPLANT
BLADE SURG SZ10 CARB STEEL (BLADE) ×4 IMPLANT
BNDG CMPR MED 15X6 ELC VLCR LF (GAUZE/BANDAGES/DRESSINGS) ×1
BNDG ELASTIC 6X15 VLCR STRL LF (GAUZE/BANDAGES/DRESSINGS) ×1 IMPLANT
BNDG GAUZE ELAST 4 BULKY (GAUZE/BANDAGES/DRESSINGS) ×1 IMPLANT
BOWL SMART MIX CTS (DISPOSABLE) IMPLANT
BSPLAT TIB 3 KN TRITANIUM (Knees) ×1 IMPLANT
CHLORAPREP W/TINT 26 (MISCELLANEOUS) ×2 IMPLANT
COMP POSTERIOR FEMORAL SZ4 LFT (Femur) ×2 IMPLANT
COMPONENT PSTRER FEMRL SZ4 LT (Femur) IMPLANT
COVER SURGICAL LIGHT HANDLE (MISCELLANEOUS) ×2 IMPLANT
COVER WAND RF STERILE (DRAPES) IMPLANT
CUFF TOURN SGL QUICK 34 (TOURNIQUET CUFF) ×2
CUFF TRNQT CYL 34X4.125X (TOURNIQUET CUFF) ×1 IMPLANT
DECANTER SPIKE VIAL GLASS SM (MISCELLANEOUS) IMPLANT
DRAPE U-SHAPE 47X51 STRL (DRAPES) ×2 IMPLANT
DRSG PAD ABDOMINAL 8X10 ST (GAUZE/BANDAGES/DRESSINGS) ×4 IMPLANT
ELECT REM PT RETURN 15FT ADLT (MISCELLANEOUS) ×2 IMPLANT
GAUZE SPONGE 4X4 12PLY STRL (GAUZE/BANDAGES/DRESSINGS) ×2 IMPLANT
GAUZE XEROFORM 1X8 LF (GAUZE/BANDAGES/DRESSINGS) IMPLANT
GLOVE BIO SURGEON STRL SZ7.5 (GLOVE) ×2 IMPLANT
GLOVE BIOGEL PI IND STRL 8 (GLOVE) ×2 IMPLANT
GLOVE BIOGEL PI INDICATOR 8 (GLOVE) ×2
GLOVE ECLIPSE 8.0 STRL XLNG CF (GLOVE) ×2 IMPLANT
GOWN STRL REUS W/TWL XL LVL3 (GOWN DISPOSABLE) ×4 IMPLANT
HANDPIECE INTERPULSE COAX TIP (DISPOSABLE) ×2
HOLDER FOLEY CATH W/STRAP (MISCELLANEOUS) IMPLANT
IMMOBILIZER KNEE 20 (SOFTGOODS) ×3 IMPLANT
IMMOBILIZER KNEE 20 THIGH 36 (SOFTGOODS) ×1 IMPLANT
INSERT TIB TRIATH 12 S3 (Insert) ×1 IMPLANT
KIT TURNOVER KIT A (KITS) IMPLANT
KNEE PATELLA ASYMMETRIC 9X29 (Knees) ×1 IMPLANT
KNEE TIBIAL COMPONENT SZ3 (Knees) ×1 IMPLANT
NS IRRIG 1000ML POUR BTL (IV SOLUTION) ×2 IMPLANT
PACK TOTAL KNEE CUSTOM (KITS) ×2 IMPLANT
PAD ABD 7.5X8 STRL (GAUZE/BANDAGES/DRESSINGS) ×1 IMPLANT
PADDING CAST COTTON 6X4 STRL (CAST SUPPLIES) ×4 IMPLANT
PIN FLUTED HEDLESS FIX 3.5X1/8 (Miscellaneous) ×1 IMPLANT
PROTECTOR NERVE ULNAR (MISCELLANEOUS) ×2 IMPLANT
SET HNDPC FAN SPRY TIP SCT (DISPOSABLE) ×1 IMPLANT
SET PAD KNEE POSITIONER (MISCELLANEOUS) ×2 IMPLANT
STAPLER VISISTAT 35W (STAPLE) IMPLANT
STRIP CLOSURE SKIN 1/2X4 (GAUZE/BANDAGES/DRESSINGS) IMPLANT
SUT MNCRL AB 4-0 PS2 18 (SUTURE) ×1 IMPLANT
SUT VIC AB 0 CT1 27 (SUTURE) ×2
SUT VIC AB 0 CT1 27XBRD ANTBC (SUTURE) ×1 IMPLANT
SUT VIC AB 1 CT1 36 (SUTURE) ×4 IMPLANT
SUT VIC AB 2-0 CT1 27 (SUTURE) ×4
SUT VIC AB 2-0 CT1 TAPERPNT 27 (SUTURE) ×2 IMPLANT
TRAY FOLEY MTR SLVR 16FR STAT (SET/KITS/TRAYS/PACK) ×2 IMPLANT
WATER STERILE IRR 1000ML POUR (IV SOLUTION) ×2 IMPLANT
WRAP KNEE MAXI GEL POST OP (GAUZE/BANDAGES/DRESSINGS) ×2 IMPLANT
YANKAUER SUCT BULB TIP 10FT TU (MISCELLANEOUS) ×2 IMPLANT

## 2019-03-10 NOTE — Anesthesia Postprocedure Evaluation (Signed)
Anesthesia Post Note  Patient: [name]  Procedure(s) Performed: LEFT TOTAL KNEE ARTHROPLASTY (Left Knee)     Patient location during evaluation: PACU Anesthesia Type: Spinal Level of consciousness: oriented and awake and alert Pain management: pain level controlled Vital Signs Assessment: post-procedure vital signs reviewed and stable Respiratory status: spontaneous breathing, respiratory function stable and patient connected to nasal cannula oxygen Cardiovascular status: blood pressure returned to baseline and stable Postop Assessment: no headache, no backache and no apparent nausea or vomiting Anesthetic complications: no    Last Vitals:  Vitals:   03/10/19 1045 03/10/19 1100  BP: 130/70 137/79  Pulse: 65 64  Resp: 10 12  Temp:    SpO2: 100% 100%    Last Pain:  Vitals:   03/10/19 1100  TempSrc:   PainSc: Asleep                 Philmore Lepore S

## 2019-03-10 NOTE — Anesthesia Preprocedure Evaluation (Signed)
Anesthesia Evaluation  Patient identified by MRN, date of birth, ID band Patient awake    Reviewed: Allergy & Precautions, NPO status , Patient's Chart, lab work & pertinent test results  Airway Mallampati: II  TM Distance: >3 FB Neck ROM: Full    Dental no notable dental hx.    Pulmonary neg pulmonary ROS,    Pulmonary exam normal breath sounds clear to auscultation       Cardiovascular hypertension, Normal cardiovascular exam Rhythm:Regular Rate:Normal  Untreated HTN   Neuro/Psych negative neurological ROS  negative psych ROS   GI/Hepatic Neg liver ROS, GERD  ,  Endo/Other  negative endocrine ROS  Renal/GU negative Renal ROS  negative genitourinary   Musculoskeletal negative musculoskeletal ROS (+)   Abdominal   Peds negative pediatric ROS (+)  Hematology negative hematology ROS (+)   Anesthesia Other Findings   Reproductive/Obstetrics negative OB ROS                             Anesthesia Physical Anesthesia Plan  ASA: III  Anesthesia Plan: Spinal   Post-op Pain Management:  Regional for Post-op pain   Induction: Intravenous  PONV Risk Score and Plan: 2 and Ondansetron and Dexamethasone  Airway Management Planned: Simple Face Mask  Additional Equipment:   Intra-op Plan:   Post-operative Plan:   Informed Consent: I have reviewed the patients History and Physical, chart, labs and discussed the procedure including the risks, benefits and alternatives for the proposed anesthesia with the patient or authorized representative who has indicated his/her understanding and acceptance.     Dental advisory given  Plan Discussed with: CRNA and Surgeon  Anesthesia Plan Comments:         Anesthesia Quick Evaluation

## 2019-03-10 NOTE — Plan of Care (Signed)
Plan of care for post op day 0 discussed with patient.  

## 2019-03-10 NOTE — Evaluation (Addendum)
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 Min assist for supine to sit and to pivot to recliner. Pt became dizzy in standing, ambulation deferred. BP sitting after stand pivot transfer to recliner 124/83 (157/97 in sitting prior to transfer).  HR 72.  Pt reports h/o becoming orthostatic in standing after prior R UKA surgery in 2018.  Good progress expected. Pt will benefit from skilled PT to increase their independence and safety with mobility to allow discharge to the venue listed below.      Follow Up Recommendations Follow surgeon's recommendation for DC plan and follow-up therapies    Equipment Recommendations  None recommended by PT    Recommendations for Other Services       Precautions / Restrictions Precautions Precautions: Knee Restrictions Weight Bearing Restrictions: No Other Position/Activity Restrictions: WBAT      Mobility  Bed Mobility Overal bed mobility: Needs Assistance Bed Mobility: Supine to Sit     Supine to sit: Min assist     General bed mobility comments: support for LLE  Transfers Overall transfer level: Needs assistance Equipment used: Rolling walker (2 wheeled) Transfers: Sit to/from [employer] Sit to Stand: Min guard Stand pivot transfers: Min assist       General transfer comment: pt dizzy in standing, deferred ambulation   Ambulation/Gait                Stairs            Wheelchair Mobility    Modified Rankin (Stroke Patients Only)       Balance Overall balance assessment: Modified Independent                                           Pertinent Vitals/Pain Pain Assessment: 0-10 Pain Score: 7  Pain Location: L knee Pain Descriptors  / Indicators: Sore Pain Intervention(s): Limited activity within patient's tolerance;Monitored during session;Premedicated before session;Ice applied    Home Living Family/patient expects to be discharged to:: Private residence Living Arrangements: Alone Available Help at Discharge: Family; son to assist pt at home Type of Home: House Home Access: Stairs to enter Entrance Stairs-Rails: Doctor, general practice of Steps: 4 Home Layout: Two level;Able to live on main level with bedroom/bathroom Home Equipment: Dan Humphreys - 2 wheels;Cane - single point;Bedside commode;Grab bars - toilet      Prior Function Level of Independence: Independent with assistive device(s)         Comments: used cane     Hand Dominance        Extremity/Trunk Assessment   Upper Extremity Assessment Upper Extremity Assessment: Overall WFL for tasks assessed    Lower Extremity Assessment Lower Extremity Assessment: LLE deficits/detail LLE Deficits / Details: AAROM 5-50* L knee LLE Sensation: WNL    Cervical / Trunk Assessment Cervical / Trunk Assessment: Normal  Communication   Communication: No difficulties  Cognition Arousal/Alertness: Awake/alert Behavior During Therapy: WFL for tasks assessed/performed Overall Cognitive Status: Within Functional Limits for tasks assessed  General Comments      Exercises Total Joint Exercises Ankle Circles/Pumps: AROM;Both;10 reps;Supine Knee Flexion: AROM;Left;10 reps;Seated Goniometric ROM: 5-50* AAROM L knee   Assessment/Plan    PT Assessment Patient needs continued PT services  PT Problem List Decreased strength;Decreased range of motion;Decreased activity tolerance;Pain;Decreased mobility       PT Treatment Interventions Stair training;Gait training;Therapeutic exercise;Patient/family education;Therapeutic activities    PT Goals (Current goals can be found in the Care Plan  section)  Acute Rehab PT Goals Patient Stated Goal: walk dog, quilt, go up flight of stairs, be outside PT Goal Formulation: With patient Time For Goal Achievement: 03/17/19 Potential to Achieve Goals: Good    Frequency 7X/week   Barriers to discharge        Co-evaluation               AM-PAC PT "6 Clicks" Mobility  Outcome Measure Help needed turning from your back to your side while in a flat bed without using bedrails?: A Little Help needed moving from lying on your back to sitting on the side of a flat bed without using bedrails?: A Little Help needed moving to and from a bed to a chair (including a wheelchair)?: A Little Help needed standing up from a chair using your arms (e.g., wheelchair or bedside chair)?: A Little Help needed to walk in hospital room?: A Lot Help needed climbing 3-5 steps with a railing? : A Lot 6 Click Score: 16    End of Session Equipment Utilized During Treatment: Gait belt Activity Tolerance: Treatment limited secondary to medical complications (Comment)(dizzy in standing/ orthostatic) Patient left: in chair   PT Visit Diagnosis: Difficulty in walking, not elsewhere classified (R26.2);Pain Pain - Right/Left: Left Pain - part of body: Knee    Time: 3825-0539 PT Time Calculation (min) (ACUTE ONLY): 25 min   Charges:   PT Evaluation $PT Eval Low Complexity: 1 Low PT Treatments $Therapeutic Activity: 8-22 mins       Ralene Bathe Kistler PT 03/10/2019  Acute Rehabilitation Services Pager [phone] Office [phone]

## 2019-03-10 NOTE — Progress Notes (Signed)
Assisted Dr. Rose with left, ultrasound guided, adductor canal block. Side rails up, monitors on throughout procedure. See vital signs in flow sheet. Tolerated Procedure well.  

## 2019-03-10 NOTE — Transfer of Care (Signed)
Immediate Anesthesia Transfer of Care Note  Patient: [name]  Procedure(s) Performed: LEFT TOTAL KNEE ARTHROPLASTY (Left Knee)  Patient Location: PACU  Anesthesia Type:Regional and Spinal  Level of Consciousness: awake, alert  and oriented  Airway & Oxygen Therapy: Patient Spontanous Breathing and Patient connected to face mask oxygen  Post-op Assessment: Report given to RN and Post -op Vital signs reviewed and stable  Post vital signs: Reviewed and stable  Last Vitals:  Vitals Value Taken Time  BP 123/63 03/10/2019 10:28 AM  Temp    Pulse 72 03/10/2019 10:30 AM  Resp 16 03/10/2019 10:30 AM  SpO2 100 % 03/10/2019 10:30 AM  Vitals shown include unvalidated device data.  Last Pain:  Vitals:   03/10/19 0613  TempSrc: Oral         Complications: No apparent anesthesia complications

## 2019-03-10 NOTE — Op Note (Signed)
NAME: [name], [name] MEDICAL RECORD UO:[id number] ACCOUNT [id number] DATE OF BIRTH:1956-10-09 FACILITY: [facility] LOCATION: [facility] PHYSICIAN:Mckayla Mulcahey Aretha Parrot, MD  OPERATIVE REPORT  DATE OF PROCEDURE:  03/10/2019  PREOPERATIVE DIAGNOSES:  Primary osteoarthritis and degenerative joint disease, left knee.  POSTOPERATIVE DIAGNOSES:  Primary osteoarthritis and degenerative joint disease, left knee.  PROCEDURE:  Left total knee arthroplasty.  IMPLANTS:  Stryker Triathlon press-fit knee system with size 4 press-fit femur, size 3 press-fit tibial tray, 12 mm fixed-bearing polyethylene insert, size 29 press-fit patellar button.  SURGEON:  Vanita Panda. Magnus Ivan, MD  ASSISTANT:  Richardean Canal, PA-C  ANESTHESIA: 1.  Left lower extremity adductor canal block. 2.  Spinal.  TOURNIQUET TIME:  Under 1 hour.  ESTIMATED BLOOD LOSS:  Less than 100 mL.  ANTIBIOTICS:  Two grams IV Ancef.  COMPLICATIONS:  None.  INDICATIONS:  The patient is a very pleasant 63 year old active female well known to me.  She has bilateral knee osteoarthritis, and we actually placed a right partial knee replacement which was a unicompartmental knee replacement for her right knee.   Her left knee has become more painful to her.  She has actually tricompartmental arthritis in that knee, so we are recommending a total knee arthroplasty at this point given her continued pain, her decreased mobility, and the impact her arthritis has had  negatively on her quality of life and her activities of daily living.  She understands with total knee arthroplasty there is risk of acute blood loss anemia, nerve or vessel injury, fracture, infection, DVT as well as implant failure.  She understands  her goals are to decrease pain, improve mobility and overall improve quality of life.  DESCRIPTION OF PROCEDURE:  After informed consent was obtained and appropriate left knee was marked, an adductor canal block was obtained in the  holding room.  She was then brought to the operating room and sat up on the operating table where spinal  anesthesia was then obtained.  She was then laid in supine position on the operating table.  A Foley catheter was placed, and a nonsterile tourniquet was placed around her upper left thigh.  Her left thigh, knee, leg, ankle and foot were prepped and  draped with ChloraPrep in sterile drapes.  A time-out was called, and she was identified as correct patient, correct left knee.  I then used an Esmarch to wrap that leg, and tourniquet was inflated to 300 mm of pressure.  I made a direct midline incision  over the patella and carried this proximally and distally.  I dissected down the knee joint and carried out a medial parapatellar arthrotomy, finding very large joint effusion and significant periarticular osteophytes throughout the knee, especially  with bone-on-bone wear in the medial compartment.  There were loose bodies throughout the knee as well.  With the knee in a flexed position, I removed remnants of ACL, PCL, medial and lateral meniscus.  We set her extramedullary cutting guide for making  our proximal tibia cut, correcting for varus and valgus and neutral slope and taking 9 mm off the high side.  We made this cut without difficulty.  We then used an intramedullary guide for our distal femoral cut, drilling through the notch and setting  this distal femoral cut for an 8 mm distal femoral cut for a left knee at 5 degrees externally rotated.  We made that cut without difficulty.  We then brought the knee back down to full extension and removed other parts of the meniscus from the  back of  the knee and placed a 9 mm extension block, and she was slightly hyperextended.  We then went back to the femoral sizing guide based off the epicondylar axis and Whiteside line.  Based off this, I chose a size 4 femur.  We put our 4-in-1 cutting block  for a size 4 femur, made our anterior and posterior cuts,  followed by our chamfer cuts.  We then made our femoral box cut.  Then we went back to the tibia and chose a size 3 tibia for coverage, setting the rotation off the tibial tubercle and the femur.   We made our press-fit keel punch for this.  Of note, at 63 years old and being Caucasian, it is significantly hard bone, so we felt that a press-fit was appropriate.  With a size 4 trial femur for a left knee and a size 3 trial tibial tray, we trialed  an 11 mm fixed-bearing polyethylene insert, and we were pleased with range of motion and stability with that.  We then made our patellar cut and drilled 3 holes for a size 29 press-fit patellar button.  We then removed all instrumentation from the knee  and irrigated the knee with normal saline solution.  I then placed 30 mL of 0.25% Marcaine with epinephrine around the capsule of the knee joint.  We then dried the knee real well with the knee in a flexed position, placed our real Stryker press-fit  tibial tray size 3, followed by real size 4 press-fit femur.  We went with our 11 mm fixed-bearing polyethylene insert and press-fit our patellar button, but as I put the knee through range of motion, I felt that there was just a little click at the  posterolateral aspect of the knee, and I felt that she needed just a little bit bigger poly on rotation, so I removed that 11 poly and went with a size 12.  I was pleased with the stability on the size 12 with good range of motion.  We then closed the  arthrotomy with interrupted #1 Vicryl suture, followed by 0 Vicryl in the deep tissue, 2-0 Vicryl subcutaneous tissue, and interrupted staples on the skin.  Xeroform and Aquacel dressing were applied.  She was taken to recovery room in stable condition.   All final counts were correct.  There were no complications noted.  Of note, Rexene Edison, PA-C, did assist during the entire case.  His assistance was crucial for facilitating all aspects of this case.  LN/NUANCE   D:03/10/2019 T:03/10/2019 JOB:[id number]

## 2019-03-10 NOTE — Anesthesia Procedure Notes (Signed)
Anesthesia Regional Block: Adductor canal block   Pre-Anesthetic Checklist: ,, timeout performed, Correct Patient, Correct Site, Correct Laterality, Correct Procedure, Correct Position, site marked, Risks and benefits discussed,  Surgical consent,  Pre-op evaluation,  At surgeon's request and post-op pain management  Laterality: Left  Prep: chloraprep       Needles:  Injection technique: Single-shot  Needle Type: Echogenic Needle     Needle Length: 9cm      Additional Needles:   Procedures:,,,, ultrasound used (permanent image in chart),,,,  Narrative:  Start time: 03/10/2019 8:08 AM End time: 03/10/2019 8:16 AM Injection made incrementally with aspirations every 5 mL.  Performed by: Personally  Anesthesiologist: Eilene Ghazi, MD  Additional Notes: Patient tolerated the procedure well without complications

## 2019-03-10 NOTE — Anesthesia Procedure Notes (Signed)
Spinal  Patient location during procedure: OR Start time: 03/10/2019 8:35 AM End time: 03/10/2019 8:45 AM Staffing Anesthesiologist: Eilene Ghazi, MD Preanesthetic Checklist Completed: patient identified, site marked, surgical consent, pre-op evaluation, timeout performed, IV checked, risks and benefits discussed and monitors and equipment checked Spinal Block Patient position: sitting Prep: DuraPrep Patient monitoring: heart rate, cardiac monitor, continuous pulse ox and blood pressure Approach: midline Location: L3-4 Injection technique: single-shot Needle Needle type: Spinocan  Needle gauge: 22 G Needle length: 9 cm Needle insertion depth: 8.5 cm Assessment Sensory level: T4 Events: paresthesia Additional Notes crna attempt x 3 with 24G , no CSF, paresthesia on L  Went up a level, switched to 22g

## 2019-03-10 NOTE — H&P (Signed)
TOTAL KNEE ADMISSION H&P  Patient is being admitted for left total knee arthroplasty.  Subjective:  Chief Complaint:left knee pain.  HPI: [name], 63 y.o. female, has a history of pain and functional disability in the left knee due to arthritis and has failed non-surgical conservative treatments for greater than 12 weeks to includeNSAID's and/or analgesics, corticosteriod injections, viscosupplementation injections, flexibility and strengthening excercises, weight reduction as appropriate and activity modification.  Onset of symptoms was gradual, starting 2 years ago with gradually worsening course since that time. The patient noted no past surgery on the left knee(s).  Patient currently rates pain in the left knee(s) at 10 out of 10 with activity. Patient has night pain, worsening of pain with activity and weight bearing, pain that interferes with activities of daily living, pain with passive range of motion, crepitus and joint swelling.  Patient has evidence of subchondral sclerosis, periarticular osteophytes and joint space narrowing by imaging studies. There is no active infection.  Patient Active Problem List   Diagnosis Date Noted  . Unilateral primary osteoarthritis, left knee 02/09/2019  . Hypertension 08/15/2018  . GERD (gastroesophageal reflux disease) 08/15/2018  . Hyperlipidemia 08/15/2018  . History of unicondylar arthroplasty of right knee 07/02/2017  . Unilateral primary osteoarthritis, right knee 05/19/2017  . Chronic pain of right knee 05/19/2017   Past Medical History:  Diagnosis Date  . Arthritis   . GERD (gastroesophageal reflux disease)   . HLD (hyperlipidemia)   . Hypertension    HAS NOT BEEN ON BP MEDS IN THE LAST 4 YEARS. REPORTS HER PCP IS AWARE   . Pneumonia    4 years ago     Past Surgical History:  Procedure Laterality Date  . APPENDECTOMY     about 10 years ago   . CHOLECYSTECTOMY     4 years ago   . KNEE ARTHROSCOPY Right 02/2017  .  PARTIAL KNEE ARTHROPLASTY Right 07/02/2017   Procedure: RIGHT UNICOMPARTMENTAL KNEE ARTHROPLASTY;  Surgeon: Kathryne Hitch, MD;  Location: [facility];  Service: Orthopedics;  Laterality: Right;  with block  . TUBAL LIGATION     38 years ago     Current Facility-Administered Medications  Medication Dose Route Frequency Provider Last Rate Last Dose  . ceFAZolin (ANCEF) IVPB 2g/100 mL premix  2 g Intravenous On Call to OR Kirtland Bouchard, PA-C      . chlorhexidine (HIBICLENS) 4 % liquid 4 application  60 mL Topical Once Richardean Canal W, PA-C      . fentaNYL (SUBLIMAZE) 100 MCG/2ML injection           . fentaNYL (SUBLIMAZE) injection 50-100 mcg  50-100 mcg Intravenous Once Eilene Ghazi, MD      . lactated ringers infusion   Intravenous Continuous Eilene Ghazi, MD 50 mL/hr at 03/10/19 [phone]    . midazolam (VERSED) 2 MG/2ML injection           . midazolam (VERSED) injection 1-2 mg  1-2 mg Intravenous Once Eilene Ghazi, MD      . tranexamic acid (CYKLOKAPRON) IVPB 1,000 mg  1,000 mg Intravenous To OR Kirtland Bouchard, PA-C       Allergies  Allergen Reactions  . Statins     Short term memory loss    Social History   Tobacco Use  . Smoking status: Never Smoker  . Smokeless tobacco: Never Used  Substance Use Topics  . Alcohol use: Yes    Alcohol/week: 1.0 - 2.0 standard drinks  Types: 1 - 2 Glasses of wine per week    Family History  Adopted: Yes     Review of Systems  Musculoskeletal: Positive for joint pain.  All other systems reviewed and are negative.   Objective:  Physical Exam  Constitutional: She is oriented to person, place, and time. She appears well-developed and well-nourished.  HENT:  Head: Normocephalic and atraumatic.  Eyes: Pupils are equal, round, and reactive to light. EOM are normal.  Neck: Normal range of motion. Neck supple.  Cardiovascular: Normal rate.  Respiratory: Effort normal.  GI: Soft.  Musculoskeletal:     Left knee: She exhibits decreased  range of motion, effusion, abnormal alignment and abnormal meniscus. Tenderness found. Medial joint line and lateral joint line tenderness noted.  Neurological: She is alert and oriented to person, place, and time.  Skin: Skin is warm and dry.  Psychiatric: She has a normal mood and affect.    Vital signs in last 24 hours: Temp:  [98.6 F (37 C)] 98.6 F (37 C) (03/13 0355) Pulse Rate:  [73] 73 (03/13 0613) Resp:  [15] 15 (03/13 0613) BP: (155)/(103) 155/103 (03/13 0613) SpO2:  [100 %] 100 % (03/13 0613)  Labs:   Estimated body mass index is 34.62 kg/m (pended) as calculated from the following:   Height as of 03/06/19: (P) 5\' 6"  (1.676 m).   Weight as of 08/15/18: 97.3 kg.   Imaging Review Plain radiographs demonstrate severe degenerative joint disease of the left knee(s). The overall alignment ismild varus. The bone quality appears to be excellent for age and reported activity level.      Assessment/Plan:  End stage arthritis, left knee   The patient history, physical examination, clinical judgment of the provider and imaging studies are consistent with end stage degenerative joint disease of the left knee(s) and total knee arthroplasty is deemed medically necessary. The treatment options including medical management, injection therapy arthroscopy and arthroplasty were discussed at length. The risks and benefits of total knee arthroplasty were presented and reviewed. The risks due to aseptic loosening, infection, stiffness, patella tracking problems, thromboembolic complications and other imponderables were discussed. The patient acknowledged the explanation, agreed to proceed with the plan and consent was signed. Patient is being admitted for inpatient treatment for surgery, pain control, PT, OT, prophylactic antibiotics, VTE prophylaxis, progressive ambulation and ADL's and discharge planning. The patient is planning to be discharged home with home health  services     Patient's anticipated LOS is less than 2 midnights, meeting these requirements: - Younger than 41 - Lives within 1 hour of care - Has a competent adult at home to recover with post-op recover - NO history of  - Chronic pain requiring opiods  - Diabetes  - Coronary Artery Disease  - Heart failure  - Heart attack  - Stroke  - DVT/VTE  - Cardiac arrhythmia  - Respiratory Failure/COPD  - Renal failure  - Anemia  - Advanced Liver disease

## 2019-03-10 NOTE — Brief Op Note (Signed)
03/10/2019  10:02 AM  PATIENT:  [name]  63 y.o. female  PRE-OPERATIVE DIAGNOSIS:  osteoarthritis left knee  POST-OPERATIVE DIAGNOSIS:  LEFT TOTAL KNEE ARTHROPLASTY  PROCEDURE:  Procedure(s): LEFT TOTAL KNEE ARTHROPLASTY (Left)  SURGEON:  Surgeon(s) and Role:    Kathryne Hitch, MD - Primary  PHYSICIAN ASSISTANT:  Rexene Edison, PA-C  ANESTHESIA:   regional and spinal  EBL:  100 mL   COUNTS:  YES  TOURNIQUET:   Total Tourniquet Time Documented: Thigh (Left) - 41 minutes Total: Thigh (Left) - 41 minutes   DICTATION: .Other Dictation: Dictation Number [id number]  PLAN OF CARE: Admit to inpatient   PATIENT DISPOSITION:  PACU - hemodynamically stable.   Delay start of Pharmacological VTE agent (>24hrs) due to surgical blood loss or risk of bleeding: no

## 2019-03-10 NOTE — Anesthesia Procedure Notes (Signed)
Anesthesia Procedure Image    

## 2019-03-11 LAB — CBC
HCT: 36.9 % (ref 36.0–46.0)
Hemoglobin: 11.7 g/dL — ABNORMAL LOW (ref 12.0–15.0)
MCH: 30.8 pg (ref 26.0–34.0)
MCHC: 31.7 g/dL (ref 30.0–36.0)
MCV: 97.1 fL (ref 80.0–100.0)
PLATELETS: 250 10*3/uL (ref 150–400)
RBC: 3.8 MIL/uL — ABNORMAL LOW (ref 3.87–5.11)
RDW: 12.3 % (ref 11.5–15.5)
WBC: 12.7 10*3/uL — ABNORMAL HIGH (ref 4.0–10.5)
nRBC: 0 % (ref 0.0–0.2)

## 2019-03-11 LAB — BASIC METABOLIC PANEL
Anion gap: 11 (ref 5–15)
BUN: 8 mg/dL (ref 8–23)
CO2: 25 mmol/L (ref 22–32)
Calcium: 8.9 mg/dL (ref 8.9–10.3)
Chloride: 99 mmol/L (ref 98–111)
Creatinine, Ser: 0.62 mg/dL (ref 0.44–1.00)
GFR calc Af Amer: >60 mL/min (ref >60)
GFR calc non Af Amer: >60 mL/min (ref >60)
Glucose, Bld: 129 mg/dL — ABNORMAL HIGH (ref 70–99)
Potassium: 4 mmol/L (ref 3.5–5.1)
SODIUM: 135 mmol/L (ref 135–145)

## 2019-03-11 MED ORDER — METHOCARBAMOL 500 MG PO TABS: 500.0000 mg | ORAL_TABLET | Freq: Four times a day (QID) | ORAL | 0 refills | Status: DC | PRN

## 2019-03-11 MED ORDER — ASPIRIN 81 MG PO CHEW: 81.0000 mg | CHEWABLE_TABLET | Freq: Two times a day (BID) | ORAL | 0 refills | Status: AC

## 2019-03-11 MED ORDER — OXYCODONE HCL 5 MG PO TABS: 5.0000 mg | ORAL_TABLET | ORAL | 0 refills | Status: AC | PRN

## 2019-03-11 NOTE — Progress Notes (Signed)
Subjective: 1 Day Post-Op Procedure(s) (LRB): LEFT TOTAL KNEE ARTHROPLASTY (Left) Patient reports pain as moderate.    Objective: Vital signs in last 24 hours: Temp:  [97.8 F (36.6 C)-98.1 F (36.7 C)] 97.9 F (36.6 C) (03/14 0935) Pulse Rate:  [64-83] 70 (03/14 0935) Resp:  [16-17] 16 (03/14 0935) BP: (124-168)/(79-99) 135/79 (03/14 0935) SpO2:  [98 %-100 %] 100 % (03/14 0935)  Intake/Output from previous day: 03/13 0701 - 03/14 0700 In: 3381.7 [I.V.:3331.7; IV Piggyback:50] Out: 1850 [Urine:1750; Blood:100] Intake/Output this shift: Total I/O In: 240 [P.O.:240] Out: -   Recent Labs    03/11/19 0512  HGB 11.7*   Recent Labs    03/11/19 0512  WBC 12.7*  RBC 3.80*  HCT 36.9  PLT 250   Recent Labs    03/11/19 0512  NA 135  K 4.0  CL 99  CO2 25  BUN 8  CREATININE 0.62  GLUCOSE 129*  CALCIUM 8.9   No results for input(s): LABPT, INR in the last 72 hours.  Sensation intact distally Intact pulses distally Dorsiflexion/Plantar flexion intact Incision: dressing C/D/I No cellulitis present Compartment soft   Assessment/Plan: 1 Day Post-Op Procedure(s) (LRB): LEFT TOTAL KNEE ARTHROPLASTY (Left) Up with therapy Plan for discharge tomorrow Discharge home with home health      [name] 03/11/2019, 12:05 PM

## 2019-03-11 NOTE — Progress Notes (Signed)
Physical Therapy Treatment Patient Details Name: [name] MRN: [id number] DOB: 09-Jan-1956 Today's Date: 03/11/2019    History of Present Illness L TKA; PMH R UKA July 2018    PT Comments    Pt with improved ambulation distance and L knee pain this session, required seated rest break in hallway due to fatigue. Pt tolerated LE exercises well, will require TKR exercise and stair navigation reinforcement tomorrow. PT to continue to follow acutely.    Follow Up Recommendations  Follow surgeon's recommendation for DC plan and follow-up therapies;Supervision for mobility/OOB     Equipment Recommendations  None recommended by PT    Recommendations for Other Services       Precautions / Restrictions Precautions Precautions: Knee Restrictions Weight Bearing Restrictions: No Other Position/Activity Restrictions: WBAT    Mobility  Bed Mobility Overal bed mobility: Needs Assistance Bed Mobility: Sit to Supine     Supine to sit: Min assist;HOB elevated Sit to supine: Min assist;HOB elevated   General bed mobility comments: Min assist for supine<>sit for LLE support and lifting.   Transfers Overall transfer level: Needs assistance Equipment used: Rolling walker (2 wheeled) Transfers: Sit to/from Stand Sit to Stand: Min guard         General transfer comment: Min guard for safety. Sit to stand x2 during session, once from bed and once from recliner in hallway.   Ambulation/Gait Ambulation/Gait assistance: Min guard(PT brought recliner along ) Gait Distance (Feet): 100 Feet(1x20 ft to and from bathroom, 1x40 ft, 1x40 ft) Assistive device: Rolling walker (2 wheeled) Gait Pattern/deviations: Step-to pattern;Decreased step length - left;Decreased stance time - left;Trunk flexed;Antalgic Gait velocity: decr    General Gait Details: Min guard for safety. Attempted to progress pt to step-through gait this session, but pt reporting very increased pain. Pt required  one seated rest break during hallway ambulation.   Stairs             Wheelchair Mobility    Modified Rankin (Stroke Patients Only)       Balance Overall balance assessment: Mild deficits observed, not formally tested                                          Cognition Arousal/Alertness: Awake/alert Behavior During Therapy: WFL for tasks assessed/performed Overall Cognitive Status: Within Functional Limits for tasks assessed                                        Exercises Total Joint Exercises Towel Squeeze: AROM;Both;10 reps;Supine Short Arc Quad: AAROM;Left;10 reps Hip ABduction/ADduction: AAROM;Left;5 reps;Supine Straight Leg Raises: AAROM;Left;5 reps;Supine Knee Flexion: AROM;Left;10 reps;Seated Goniometric ROM: L knee aarom ~10-60*, limited by pain     General Comments        Pertinent Vitals/Pain Pain Assessment: 0-10 Pain Score: 3  Pain Location: L knee, L thigh  Pain Descriptors / Indicators: Sore Pain Intervention(s): Limited activity within patient's tolerance;RN gave pain meds during session;Monitored during session;Repositioned;Ice applied    Home Living                      Prior Function            PT Goals (current goals can now be found in the care plan section) Acute Rehab PT Goals Patient Stated  Goal: walk dog, quilt, go up flight of stairs, be outside PT Goal Formulation: With patient Time For Goal Achievement: 03/17/19 Potential to Achieve Goals: Good Progress towards PT goals: Progressing toward goals    Frequency    7X/week      PT Plan Current plan remains appropriate    Co-evaluation              AM-PAC PT "6 Clicks" Mobility   Outcome Measure  Help needed turning from your back to your side while in a flat bed without using bedrails?: A Little Help needed moving from lying on your back to sitting on the side of a flat bed without using bedrails?: A Little Help  needed moving to and from a bed to a chair (including a wheelchair)?: A Little Help needed standing up from a chair using your arms (e.g., wheelchair or bedside chair)?: A Little Help needed to walk in hospital room?: A Little Help needed climbing 3-5 steps with a railing? : A Lot 6 Click Score: 17    End of Session Equipment Utilized During Treatment: Gait belt Activity Tolerance: Patient limited by fatigue;Patient limited by pain Patient left: in bed;with bed alarm set;with call bell/phone within reach;with SCD's reapplied Nurse Communication: Mobility status PT Visit Diagnosis: Difficulty in walking, not elsewhere classified (R26.2);Pain Pain - Right/Left: Left Pain - part of body: Knee     Time: 1610-9604 PT Time Calculation (min) (ACUTE ONLY): 39 min  Charges:  $Gait Training: 8-22 mins $Therapeutic Exercise: 8-22 mins                     Nicola Police, PT Acute Rehabilitation Services Pager [phone]  Office [phone]    Derron Pipkins D Despina Hidden 03/11/2019, 6:40 PM

## 2019-03-11 NOTE — Evaluation (Signed)
Occupational Therapy Evaluation Patient Details Name: [name] MRN: [id number] DOB: 01/30/1956 Today's Date: 03/11/2019    History of Present Illness L TKA; PMH R UKA July 2018   Clinical Impression   This 63 year old female was admitted for the above sx. She completed ADL from recliner and had walked to bathroom earlier. Pt is limited by pain. Will follow in acute setting with min guard to supervision level goals.  Her son will stay with her at home    Follow Up Recommendations  Supervision/Assistance - 24 hour    Equipment Recommendations  None recommended by OT(has [employer])    Recommendations for Other Services       Precautions / Restrictions Precautions Precautions: Knee Required Braces or Orthoses: Knee Immobilizer - Left Restrictions Other Position/Activity Restrictions: WBAT      Mobility Bed Mobility               General bed mobility comments: oob  Transfers   Equipment used: Rolling walker (2 wheeled)   Sit to Stand: Min assist         General transfer comment: light assist to stand and steady; cues for UE/LE placement    Balance                                           ADL either performed or assessed with clinical judgement   ADL Overall ADL's : Needs assistance/impaired Eating/Feeding: Independent   Grooming: Set up;Sitting   Upper Body Bathing: Set up   Lower Body Bathing: Moderate assistance   Upper Body Dressing : Set up   Lower Body Dressing: Maximal assistance Lower Body Dressing Details (indicate cue type and reason): attempted reacher with clearing floor by hooking RLE under L, but too painful               General ADL Comments: performed ADL from chair. Pt had walked to bathroom earlier with nursing.  Educated on knee precautions     Vision         Perception     Praxis      Pertinent Vitals/Pain Pain Score: 7  Pain Location: L knee Pain Descriptors / Indicators:  Aching;Sore Pain Intervention(s): Limited activity within patient's tolerance;Monitored during session;Premedicated before session;Repositioned     Hand Dominance     Extremity/Trunk Assessment Upper Extremity Assessment Upper Extremity Assessment: Overall WFL for tasks assessed           Communication Communication Communication: No difficulties   Cognition Arousal/Alertness: Awake/alert Behavior During Therapy: WFL for tasks assessed/performed Overall Cognitive Status: Within Functional Limits for tasks assessed                                     General Comments       Exercises     Shoulder Instructions      Home Living Family/patient expects to be discharged to:: Private residence Living Arrangements: Alone Available Help at Discharge: Family;Available 24 hours/day               Bathroom Shower/Tub: Producer, television/film/video: Handicapped height     Home Equipment: Environmental consultant - 2 wheels;Cane - single point;Bedside commode;Grab bars - toilet   Additional Comments: son will stay with pt      Prior Functioning/Environment  Level of Independence: Independent with assistive device(s)        Comments: used cane        OT Problem List: Decreased activity tolerance;Pain;Decreased knowledge of use of DME or AE      OT Treatment/Interventions: Self-care/ADL training;DME and/or AE instruction;Patient/family education    OT Goals(Current goals can be found in the care plan section) Acute Rehab OT Goals Patient Stated Goal: walk dog, quilt, go up flight of stairs, be outside OT Goal Formulation: With patient Time For Goal Achievement: 03/18/19 Potential to Achieve Goals: Good ADL Goals Pt Will Transfer to Toilet: with supervision;ambulating;bedside commode(and perform hygiene/clothing management) Pt Will Perform Tub/Shower Transfer: Shower transfer;with min guard assist;3 in 1;ambulating Additional ADL Goal #1: pt will perform bed  mobility at min guard level with leg lifter in preparation for adls  OT Frequency: Min 2X/week   Barriers to D/C:            Co-evaluation              AM-PAC OT "6 Clicks" Daily Activity     Outcome Measure Help from another person eating meals?: None Help from another person taking care of personal grooming?: A Little Help from another person toileting, which includes using toliet, bedpan, or urinal?: A Little Help from another person bathing (including washing, rinsing, drying)?: A Lot Help from another person to put on and taking off regular upper body clothing?: A Little Help from another person to put on and taking off regular lower body clothing?: A Lot 6 Click Score: 17   End of Session    Activity Tolerance: Patient tolerated treatment well Patient left: in chair;with call bell/phone within reach  OT Visit Diagnosis: Pain Pain - Right/Left: Left Pain - part of body: Knee                Time: 0569-7948 OT Time Calculation (min): 26 min Charges:  OT General Charges $OT Visit: 1 Visit OT Evaluation $OT Eval Low Complexity: 1 Low OT Treatments $Self Care/Home Management : 8-22 mins  Marica Otter, OTR/L Acute Rehabilitation Services [phone] [facility] pager [phone] office 03/11/2019  [name] 03/11/2019, 12:56 PM

## 2019-03-11 NOTE — Progress Notes (Signed)
Physical Therapy Treatment Patient Details Name: [name] MRN: [id number] DOB: 17-Dec-1956 Today's Date: 03/11/2019    History of Present Illness L TKA; PMH R UKA July 2018    PT Comments    Pt with improved ambulation distance today, but needed to stop in hallway and sit in recliner due to lightheadedness/nausea. Pt progressing slowly with ambulation and exercises, PT to see pt for second session this afternoon to progress mobility.    Follow Up Recommendations  Follow surgeon's recommendation for DC plan and follow-up therapies;Supervision for mobility/OOB     Equipment Recommendations  None recommended by PT    Recommendations for Other Services       Precautions / Restrictions Precautions Precautions: Knee Required Braces or Orthoses: Knee Immobilizer - Left Restrictions Weight Bearing Restrictions: No Other Position/Activity Restrictions: WBAT    Mobility  Bed Mobility Overal bed mobility: Needs Assistance Bed Mobility: Sit to Supine       Sit to supine: Min assist;HOB elevated   General bed mobility comments: OOB upon PT arrival, in recliner. Min assist for LLE lifting, PT instructed pt to use RLE under LLE to assist in lifting LLE into bed. Pt able to scoot up in bed without assist, increased time.   Transfers Overall transfer level: Needs assistance Equipment used: Rolling walker (2 wheeled) Transfers: Sit to/from Stand Sit to Stand: Min assist         General transfer comment: min assist for power up, steadying. Verbal cuing for hand placemnt when rising and when lowering into recliner. Pt with sit to stand x2 during session, both from recliner.   Ambulation/Gait Ambulation/Gait assistance: Min guard(PT brought recliner along ) Gait Distance (Feet): 20 Feet Assistive device: Rolling walker (2 wheeled) Gait Pattern/deviations: Step-to pattern;Decreased step length - left;Decreased stance time - left;Ataxic;Trunk flexed Gait velocity:  decr    General Gait Details: Min guard for safety. Verbal cuing for placement in RW, sequencing, turning with RW. After 20 ft ambulation, pt reporting lightheadedness and nausea. Pt instructed to sit, symptoms improved with rest. Pt wheeled back to room in recliner.   Stairs             Wheelchair Mobility    Modified Rankin (Stroke Patients Only)       Balance Overall balance assessment: Mild deficits observed, not formally tested                                          Cognition Arousal/Alertness: Awake/alert Behavior During Therapy: WFL for tasks assessed/performed Overall Cognitive Status: Within Functional Limits for tasks assessed                                        Exercises Total Joint Exercises Ankle Circles/Pumps: AROM;Both;10 reps;Seated Quad Sets: AROM;Left;10 reps;Seated Heel Slides: Left;5 reps;Seated Goniometric ROM: L knee aarom ~5-45*, limited by pain and stiffness     General Comments        Pertinent Vitals/Pain Pain Assessment: 0-10 Pain Score: 7  Pain Location: L knee Pain Descriptors / Indicators: Aching;Sore Pain Intervention(s): Limited activity within patient's tolerance;Premedicated before session;Monitored during session;Repositioned;Ice applied    Home Living Family/patient expects to be discharged to:: Private residence Living Arrangements: Alone Available Help at Discharge: Family;Available 24 hours/day  Home Equipment: Walker - 2 wheels;Cane - single point;Bedside commode;Grab bars - toilet Additional Comments: son will stay with pt    Prior Function Level of Independence: Independent with assistive device(s)      Comments: used cane   PT Goals (current goals can now be found in the care plan section) Acute Rehab PT Goals Patient Stated Goal: walk dog, quilt, go up flight of stairs, be outside PT Goal Formulation: With patient Time For Goal Achievement: 03/17/19 Potential  to Achieve Goals: Good Progress towards PT goals: Progressing toward goals    Frequency    7X/week      PT Plan Current plan remains appropriate    Co-evaluation              AM-PAC PT "6 Clicks" Mobility   Outcome Measure  Help needed turning from your back to your side while in a flat bed without using bedrails?: A Little Help needed moving from lying on your back to sitting on the side of a flat bed without using bedrails?: A Little Help needed moving to and from a bed to a chair (including a wheelchair)?: A Little Help needed standing up from a chair using your arms (e.g., wheelchair or bedside chair)?: A Little Help needed to walk in hospital room?: A Little Help needed climbing 3-5 steps with a railing? : A Lot 6 Click Score: 17    End of Session Equipment Utilized During Treatment: Gait belt Activity Tolerance: Patient limited by fatigue;Patient limited by pain;Treatment limited secondary to medical complications (Comment)(nausea, lightheadedness) Patient left: in bed;with bed alarm set;with call bell/phone within reach;with SCD's reapplied Nurse Communication: Mobility status PT Visit Diagnosis: Difficulty in walking, not elsewhere classified (R26.2);Pain Pain - Right/Left: Left Pain - part of body: Knee     Time: 1245-1305 PT Time Calculation (min) (ACUTE ONLY): 20 min  Charges:  $Gait Training: 8-22 mins                     Nicola Police, PT Acute Rehabilitation Services Pager [phone]  Office [phone]    Kamaljit Hizer D Despina Hidden 03/11/2019, 2:36 PM

## 2019-03-11 NOTE — TOC Initial Note (Signed)
Transition of Care [facility]) - Initial/Assessment Note    Patient Details  Name: [name] MRN: [id number] Date of Birth: 22-Jun-1956  Transition of Care [facility]) CM/SW Contact:    Norina Buzzard, RN Phone Number: 03/11/2019, 5:11 PM  Clinical Narrative:  63 yo F s/p L TKA. Met with pt. She plans to return home with the support of her son. She has a 3-in-1 BSC and a RW from previous knee surgery. Discussed [place] agency. Referral made to [facility] prior to surgery and pt agrees with agency.                 Expected Discharge Plan: [place] Barriers to Discharge: No Barriers Identified   Patient Goals and CMS Choice     Choice offered to / list presented to : Patient  Expected Discharge Plan and Services Expected Discharge Plan: [place] Discharge Planning Services: CM Consult Post Acute Care Choice: Home Health                       HH Arranged: PT [place] Agency: [facility] (formerly [facility])  Prior Living Arrangements/Services   Lives with:: Adult Children Patient language and need for interpreter reviewed:: No Do you feel safe going back to the place where you live?: Yes      Need for Family Participation in Patient Care: Yes (Comment) Care giver support system in place?: Yes (comment)      Activities of Daily Living Home Assistive Devices/Equipment: Eyeglasses, Cane (specify quad or straight) ADL Screening (condition at time of admission) Patient's cognitive ability adequate to safely complete daily activities?: Yes Is the patient deaf or have difficulty hearing?: No Does the patient have difficulty seeing, even when wearing glasses/contacts?: No Does the patient have difficulty concentrating, remembering, or making decisions?: No Patient able to express need for assistance with ADLs?: Yes Does the patient have difficulty dressing or bathing?: No Independently performs ADLs?: Yes  (appropriate for developmental age) Does the patient have difficulty walking or climbing stairs?: No Weakness of Legs: None Weakness of Arms/Hands: None  Permission Sought/Granted Permission sought to share information with : Case Manager Permission granted to share information with : Yes, Verbal Permission Granted              Emotional Assessment Appearance:: Well-Groomed Attitude/Demeanor/Rapport: Engaged Affect (typically observed): Calm, Appropriate, Pleasant Orientation: : Oriented to Self, Oriented to  Time, Oriented to Place, Oriented to Situation      Admission diagnosis:  osteoarthritis left knee Patient Active Problem List   Diagnosis Date Noted  . Status post total left knee replacement 03/10/2019  . Unilateral primary osteoarthritis, left knee 02/09/2019  . Hypertension 08/15/2018  . GERD (gastroesophageal reflux disease) 08/15/2018  . Hyperlipidemia 08/15/2018  . History of unicondylar arthroplasty of right knee 07/02/2017  . Unilateral primary osteoarthritis, right knee 05/19/2017  . Chronic pain of right knee 05/19/2017   PCP:  Eunice Blase, MD Pharmacy:   [id number] [place], [place] - [address] [facility] [address]Phone: [phone]: [phone]    Social Determinants of Health (SDOH) Interventions    Readmission Risk Interventions 30 Day Unplanned Readmission Risk Score     Admission (Current) from 03/10/2019 in [facility] 30 Day Unplanned Readmission Risk Score (%)  7 Filed at 03/11/2019 1600     This score is the patient's risk of  an unplanned readmission within 30 days of being discharged (0 -100%). The score is based on dignosis, age, lab data, medications, orders, and past utilization.   Low:  0-14.9   Medium: 15-21.9   High: 22-29.9   Extreme: 30 and above       No flowsheet data found.

## 2019-03-11 NOTE — Plan of Care (Signed)
  Problem: Activity: Goal: Ability to avoid complications of mobility impairment will improve Outcome: Progressing   Problem: Pain Management: Goal: Pain level will decrease with appropriate interventions Outcome: Progressing   Problem: Education: Goal: Knowledge of the prescribed therapeutic regimen will improve Outcome: Progressing   

## 2019-03-11 NOTE — Discharge Instructions (Signed)

## 2019-03-12 NOTE — Progress Notes (Signed)
Occupational Therapy Treatment Patient Details Name: [name] MRN: [id number] DOB: 09-Feb-1956 Today's Date: 03/12/2019    History of present illness L TKA; PMH R UKA July 2018   OT comments  All education completed this session.  Practiced shower transfer and used leg lifter for OOB   Follow Up Recommendations  Supervision/Assistance - 24 hour    Equipment Recommendations  None recommended by OT    Recommendations for Other Services      Precautions / Restrictions Precautions Precautions: Knee Restrictions Weight Bearing Restrictions: No Other Position/Activity Restrictions: WBAT       Mobility Bed Mobility         Supine to sit: Min guard     General bed mobility comments: using leg lifter  Transfers   Equipment used: Rolling walker (2 wheeled)   Sit to Stand: Min guard         General transfer comment: for safety    Balance                                           ADL either performed or assessed with clinical judgement   ADL                                         General ADL Comments: used leg lifter for OOB; pt plans to get this. Simulated small shower ledge at min guard level. Reviewed use of reacher for adls     Vision       Perception     Praxis      Cognition Arousal/Alertness: Awake/alert Behavior During Therapy: WFL for tasks assessed/performed Overall Cognitive Status: Within Functional Limits for tasks assessed                                          Exercises     Shoulder Instructions       General Comments pt felt lightheaded after simulating shower.  Felt better once she sat    Pertinent Vitals/ Pain       Pain Score: 3  Pain Location: L knee, L thigh  Pain Descriptors / Indicators: Sore Pain Intervention(s): Limited activity within patient's tolerance;Monitored during session;Repositioned;Premedicated before session  Home Living                                          Prior Functioning/Environment              Frequency           Progress Toward Goals  OT Goals(current goals can now be found in the care plan section)  Progress towards OT goals: Progressing toward goals  Acute Rehab OT Goals Patient Stated Goal: walk dog, quilt, go up flight of stairs, be outside  Plan      Co-evaluation                 AM-PAC OT "6 Clicks" Daily Activity     Outcome Measure   Help from another person eating meals?: None Help from another person taking care of personal grooming?: A Little  Help from another person toileting, which includes using toliet, bedpan, or urinal?: A Little Help from another person bathing (including washing, rinsing, drying)?: A Little(with AE) Help from another person to put on and taking off regular upper body clothing?: A Little Help from another person to put on and taking off regular lower body clothing?: A Lot 6 Click Score: 18    End of Session    OT Visit Diagnosis: Pain Pain - Right/Left: Left Pain - part of body: Knee   Activity Tolerance Patient tolerated treatment well   Patient Left in chair;with call bell/phone within reach   Nurse Communication          Time: 2902-1115 OT Time Calculation (min): 20 min  Charges: OT General Charges $OT Visit: 1 Visit OT Treatments $Self Care/Home Management : 8-22 mins  [name], OTR/L Acute Rehabilitation Services [phone] [facility] pager [phone] office 03/12/2019   [name] 03/12/2019, 10:30 AM

## 2019-03-12 NOTE — Progress Notes (Signed)
Physical Therapy Treatment Patient Details Name: [name] MRN: [id number] DOB: 1956-10-25 Today's Date: 03/12/2019    History of Present Illness L TKA; PMH R UKA July 2018    PT Comments    Pt has met PT goals and is ready to DC home from PT standpoint. Stair training complete, pt demonstrates understanding of HEP.   Follow Up Recommendations  Follow surgeon's recommendation for DC plan and follow-up therapies;Supervision for mobility/OOB     Equipment Recommendations  None recommended by PT    Recommendations for Other Services       Precautions / Restrictions Precautions Precautions: Knee Precaution Booklet Issued: Yes (comment) Precaution Comments: reviewed no pillow under knee Restrictions Weight Bearing Restrictions: No Other Position/Activity Restrictions: WBAT    Mobility  Bed Mobility         Supine to sit: Min guard     General bed mobility comments: up in recliner  Transfers Overall transfer level: Modified independent Equipment used: Rolling walker (2 wheeled) Transfers: Sit to/from Stand Sit to Stand: Modified independent (Device/Increase time)         General transfer comment: for safety  Ambulation/Gait Ambulation/Gait assistance: Supervision Gait Distance (Feet): 75 Feet Assistive device: Rolling walker (2 wheeled) Gait Pattern/deviations: Step-to pattern;Decreased step length - left;Decreased stance time - left;Trunk flexed;Antalgic Gait velocity: decr    General Gait Details: supervision for safety, VCs for posture   Stairs Stairs: Yes Stairs assistance: Min guard Stair Management: One rail Right;With cane;Forwards;Step to pattern Number of Stairs: 6 General stair comments: 3 stairs x 2 trials, VCs sequencing   Wheelchair Mobility    Modified Rankin (Stroke Patients Only)       Balance Overall balance assessment: Modified Independent                                          Cognition  Arousal/Alertness: Awake/alert Behavior During Therapy: WFL for tasks assessed/performed Overall Cognitive Status: Within Functional Limits for tasks assessed                                        Exercises Total Joint Exercises Ankle Circles/Pumps: AROM;Both;10 reps;Seated Quad Sets: AROM;10 reps;Seated;Both Short Arc Quad: AAROM;Left;10 reps Heel Slides: AAROM;Left;10 reps;Supine Hip ABduction/ADduction: AAROM;Left;Supine;10 reps Straight Leg Raises: AAROM;Left;5 reps;Supine Long Arc Quad: AAROM;Left;5 reps;Seated Knee Flexion: AROM;Left;10 reps;Seated Goniometric ROM: L knee AAROM ~5-55* limited by pain    General Comments General comments (skin integrity, edema, etc.): pt felt lightheaded after simulating shower.  Felt better once she sat      Pertinent Vitals/Pain Pain Score: 6  Pain Location: L knee, L thigh after exercises Pain Descriptors / Indicators: Sore Pain Intervention(s): Limited activity within patient's tolerance;Monitored during session;Premedicated before session;Ice applied    Home Living                      Prior Function            PT Goals (current goals can now be found in the care plan section) Acute Rehab PT Goals Patient Stated Goal: walk dog, quilt, go up flight of stairs, be outside PT Goal Formulation: With patient Time For Goal Achievement: 03/17/19 Potential to Achieve Goals: Good Progress towards PT goals: Progressing toward goals    Frequency  7X/week      PT Plan Current plan remains appropriate    Co-evaluation              AM-PAC PT "6 Clicks" Mobility   Outcome Measure  Help needed turning from your back to your side while in a flat bed without using bedrails?: A Little Help needed moving from lying on your back to sitting on the side of a flat bed without using bedrails?: A Little Help needed moving to and from a bed to a chair (including a wheelchair)?: A Little Help needed standing  up from a chair using your arms (e.g., wheelchair or bedside chair)?: A Little Help needed to walk in hospital room?: A Little Help needed climbing 3-5 steps with a railing? : A Lot 6 Click Score: 17    End of Session Equipment Utilized During Treatment: Gait belt Activity Tolerance: Patient limited by fatigue;Patient limited by pain Patient left: with call bell/phone within reach;in chair Nurse Communication: Mobility status PT Visit Diagnosis: Difficulty in walking, not elsewhere classified (R26.2);Pain Pain - Right/Left: Left Pain - part of body: Knee     Time: 9563-8756 PT Time Calculation (min) (ACUTE ONLY): 29 min  Charges:  $Gait Training: 8-22 mins $Therapeutic Exercise: 8-22 mins                     Blondell Reveal Kistler PT 03/12/2019  Acute Rehabilitation Services Pager [phone] Office [phone]

## 2019-03-12 NOTE — Progress Notes (Signed)
   Subjective: 2 Days Post-Op Procedure(s) (LRB): LEFT TOTAL KNEE ARTHROPLASTY (Left) Patient reports pain as mild.    Objective: Vital signs in last 24 hours: Temp:  [97.9 F (36.6 C)-99.1 F (37.3 C)] 99 F (37.2 C) (03/15 0513) Pulse Rate:  [70-101] 98 (03/15 0513) Resp:  [16-17] 16 (03/15 0513) BP: (101-153)/(78-93) 101/78 (03/15 0513) SpO2:  [97 %-100 %] 97 % (03/15 0513) Weight:  [95.3 kg] 95.3 kg (03/14 1652)  Intake/Output from previous day: 03/14 0701 - 03/15 0700 In: 960 [P.O.:960] Out: 2300 [Urine:2300] Intake/Output this shift: No intake/output data recorded.  Recent Labs    03/11/19 0512  HGB 11.7*   Recent Labs    03/11/19 0512  WBC 12.7*  RBC 3.80*  HCT 36.9  PLT 250   Recent Labs    03/11/19 0512  NA 135  K 4.0  CL 99  CO2 25  BUN 8  CREATININE 0.62  GLUCOSE 129*  CALCIUM 8.9   No results for input(s): LABPT, INR in the last 72 hours.  Neurologically intact No results found.  Assessment/Plan: 2 Days Post-Op Procedure(s) (LRB): LEFT TOTAL KNEE ARTHROPLASTY (Left) Up with therapy, Home after PT.   [name] 03/12/2019, 9:22 AM

## 2019-03-13 ENCOUNTER — Telehealth (INDEPENDENT_AMBULATORY_CARE_PROVIDER_SITE_OTHER): Payer: Self-pay | Admitting: Radiology

## 2019-03-13 NOTE — Telephone Encounter (Signed)
Mark, PT with [facility] requests verbal orders for patient:  HHPT 3 week 1, 2 week 2- starting today.  Please call to advise. [phone]

## 2019-03-13 NOTE — Discharge Summary (Signed)
Patient ID: [name] MRN: [id number] DOB/AGE: 1956/05/01 62 y.o.  Admit date: 03/10/2019 Discharge date: 03/13/2019  Admission Diagnoses:  Principal Problem:   Unilateral primary osteoarthritis, left knee Active Problems:   Status post total left knee replacement   Discharge Diagnoses:  Same  Past Medical History:  Diagnosis Date  . Arthritis   . GERD (gastroesophageal reflux disease)   . HLD (hyperlipidemia)   . Hypertension    HAS NOT BEEN ON BP MEDS IN THE LAST 4 YEARS. REPORTS HER PCP IS AWARE   . Pneumonia    4 years ago     Surgeries: Procedure(s): LEFT TOTAL KNEE ARTHROPLASTY on 03/10/2019   Consultants:   Discharged Condition: Improved  Hospital Course: [name] is an 63 y.o. female who was admitted 03/10/2019 for operative treatment ofUnilateral primary osteoarthritis, left knee. Patient has severe unremitting pain that affects sleep, daily activities, and work/hobbies. After pre-op clearance the patient was taken to the operating room on 03/10/2019 and underwent  Procedure(s): LEFT TOTAL KNEE ARTHROPLASTY.    Patient was given perioperative antibiotics:  Anti-infectives (From admission, onward)   Start     Dose/Rate Route Frequency Ordered Stop   03/10/19 1500  ceFAZolin (ANCEF) IVPB 1 g/50 mL premix     1 g 100 mL/hr over 30 Minutes Intravenous Every 6 hours 03/10/19 1333 03/10/19 2159   03/10/19 0630  ceFAZolin (ANCEF) IVPB 2g/100 mL premix     2 g 200 mL/hr over 30 Minutes Intravenous On call to O.R. 03/10/19 8185 03/10/19 0844       Patient was given sequential compression devices, early ambulation, and chemoprophylaxis to prevent DVT.  Patient benefited maximally from hospital stay and there were no complications.    Recent vital signs: No data found.   Recent laboratory studies:  Recent Labs    03/11/19 0512  WBC 12.7*  HGB 11.7*  HCT 36.9  PLT 250  NA 135  K 4.0  CL 99  CO2 25  BUN 8  CREATININE 0.62   GLUCOSE 129*  CALCIUM 8.9     Discharge Medications:   Allergies as of 03/12/2019      Reactions   Statins    Short term memory loss      Medication List    TAKE these medications   APPLE CIDER VINEGAR PO Take 2 tablets by mouth daily.   aspirin 81 MG chewable tablet Chew 1 tablet (81 mg total) by mouth 2 (two) times daily.   Florajen Acidophilus Caps Take 1 capsule by mouth daily.   methocarbamol 500 MG tablet Commonly known as:  ROBAXIN Take 1 tablet (500 mg total) by mouth every 6 (six) hours as needed for muscle spasms.   multivitamin with minerals Tabs tablet Take 1 tablet by mouth daily.   oxyCODONE 5 MG immediate release tablet Commonly known as:  Oxy IR/ROXICODONE Take 1-2 tablets (5-10 mg total) by mouth every 4 (four) hours as needed for moderate pain (pain score 4-6).       Diagnostic Studies: Dg Knee Left Port  Result Date: 03/10/2019 CLINICAL DATA:  Status post left knee replacement today. EXAM: PORTABLE LEFT KNEE - 1-2 VIEW COMPARISON:  Frontal view dated 01/18/2019. FINDINGS: Interval left total knee prosthesis in satisfactory position and alignment. No fracture, dislocation or effusion seen. Anterior skin clips are noted. Posterior calcified synovial/loose bodies. IMPRESSION: Satisfactory postoperative appearance of a left total knee prosthesis. Electronically Signed   By: Beckie Salts M.D.   On: 03/10/2019  11:24    Disposition:     Follow-up Information    Kathryne Hitch, MD Follow up in 2 week(s).   Specialty:  Orthopedic Surgery Contact information: [address] [place] [address] [phone]        [facility] up.   Specialty:  Home Health Services Why:  [facility] will provide home health physical therapy. Contact information: [address] [place] [address] [place] [address] [phone]            Signed: Richardean Canal 03/13/2019, 11:49 AM

## 2019-03-13 NOTE — Telephone Encounter (Signed)
Verbal order left on VM  

## 2019-03-14 ENCOUNTER — Encounter (HOSPITAL_COMMUNITY): Payer: Self-pay | Admitting: Orthopaedic Surgery

## 2019-03-21 ENCOUNTER — Telehealth (INDEPENDENT_AMBULATORY_CARE_PROVIDER_SITE_OTHER): Payer: Self-pay

## 2019-03-21 NOTE — Telephone Encounter (Signed)
Dorene Sorrow from [facility] called stating patient didn't want to come to her post op appt, asking if they could extend HHPT, also take out her staples, I confirmed this was fine to do

## 2019-03-27 ENCOUNTER — Inpatient Hospital Stay (INDEPENDENT_AMBULATORY_CARE_PROVIDER_SITE_OTHER): Payer: BLUE CROSS/BLUE SHIELD | Admitting: Orthopaedic Surgery

## 2019-04-19 ENCOUNTER — Encounter (INDEPENDENT_AMBULATORY_CARE_PROVIDER_SITE_OTHER): Payer: Self-pay | Admitting: Orthopaedic Surgery

## 2019-04-25 ENCOUNTER — Encounter (INDEPENDENT_AMBULATORY_CARE_PROVIDER_SITE_OTHER): Payer: Self-pay | Admitting: Orthopaedic Surgery

## 2019-04-25 ENCOUNTER — Encounter (INDEPENDENT_AMBULATORY_CARE_PROVIDER_SITE_OTHER): Payer: Self-pay

## 2019-04-30 ENCOUNTER — Encounter: Payer: Self-pay | Admitting: Orthopaedic Surgery

## 2019-05-02 ENCOUNTER — Encounter: Payer: Self-pay | Admitting: Orthopaedic Surgery

## 2019-05-03 ENCOUNTER — Telehealth: Payer: Self-pay

## 2019-05-03 NOTE — Telephone Encounter (Signed)
Patient called concerning paperwork from [employer].  Provided patient with [employer] phone# [phone].

## 2019-05-05 ENCOUNTER — Encounter (INDEPENDENT_AMBULATORY_CARE_PROVIDER_SITE_OTHER): Payer: Self-pay

## 2019-05-05 ENCOUNTER — Telehealth: Payer: Self-pay | Admitting: Orthopaedic Surgery

## 2019-05-05 NOTE — Telephone Encounter (Signed)
Patient called and wanted to get a note to return to work.  Please call patient @ #[phone]

## 2019-05-05 NOTE — Telephone Encounter (Signed)
Faxed to provided number  

## 2019-05-05 NOTE — Telephone Encounter (Signed)
Patient aware note faxed to [facility]

## 2019-05-05 NOTE — Telephone Encounter (Signed)
[name] is calling about having papers faxed over so she can be released to come back to work  Fax # [phone]

## 2019-08-22 ENCOUNTER — Encounter: Payer: Self-pay | Admitting: Family Medicine

## 2019-09-05 ENCOUNTER — Other Ambulatory Visit: Payer: Self-pay

## 2019-09-05 DIAGNOSIS — Z20822 Contact with and (suspected) exposure to covid-19: Secondary | ICD-10-CM

## 2019-09-07 ENCOUNTER — Encounter (INDEPENDENT_AMBULATORY_CARE_PROVIDER_SITE_OTHER): Payer: Self-pay | Admitting: Family Medicine

## 2019-09-07 LAB — NOVEL CORONAVIRUS, NAA: SARS-CoV-2, NAA: NOT DETECTED

## 2020-03-27 IMAGING — DX PORTABLE LEFT KNEE - 1-2 VIEW
2 series · 2 of 2 positions shown · non-contrast
Comparison: Frontal view dated 01/18/2019.

CLINICAL DATA: Status post left knee replacement today.

EXAM:
PORTABLE LEFT KNEE - 1-2 VIEW

[knee lat (1 of 2)]
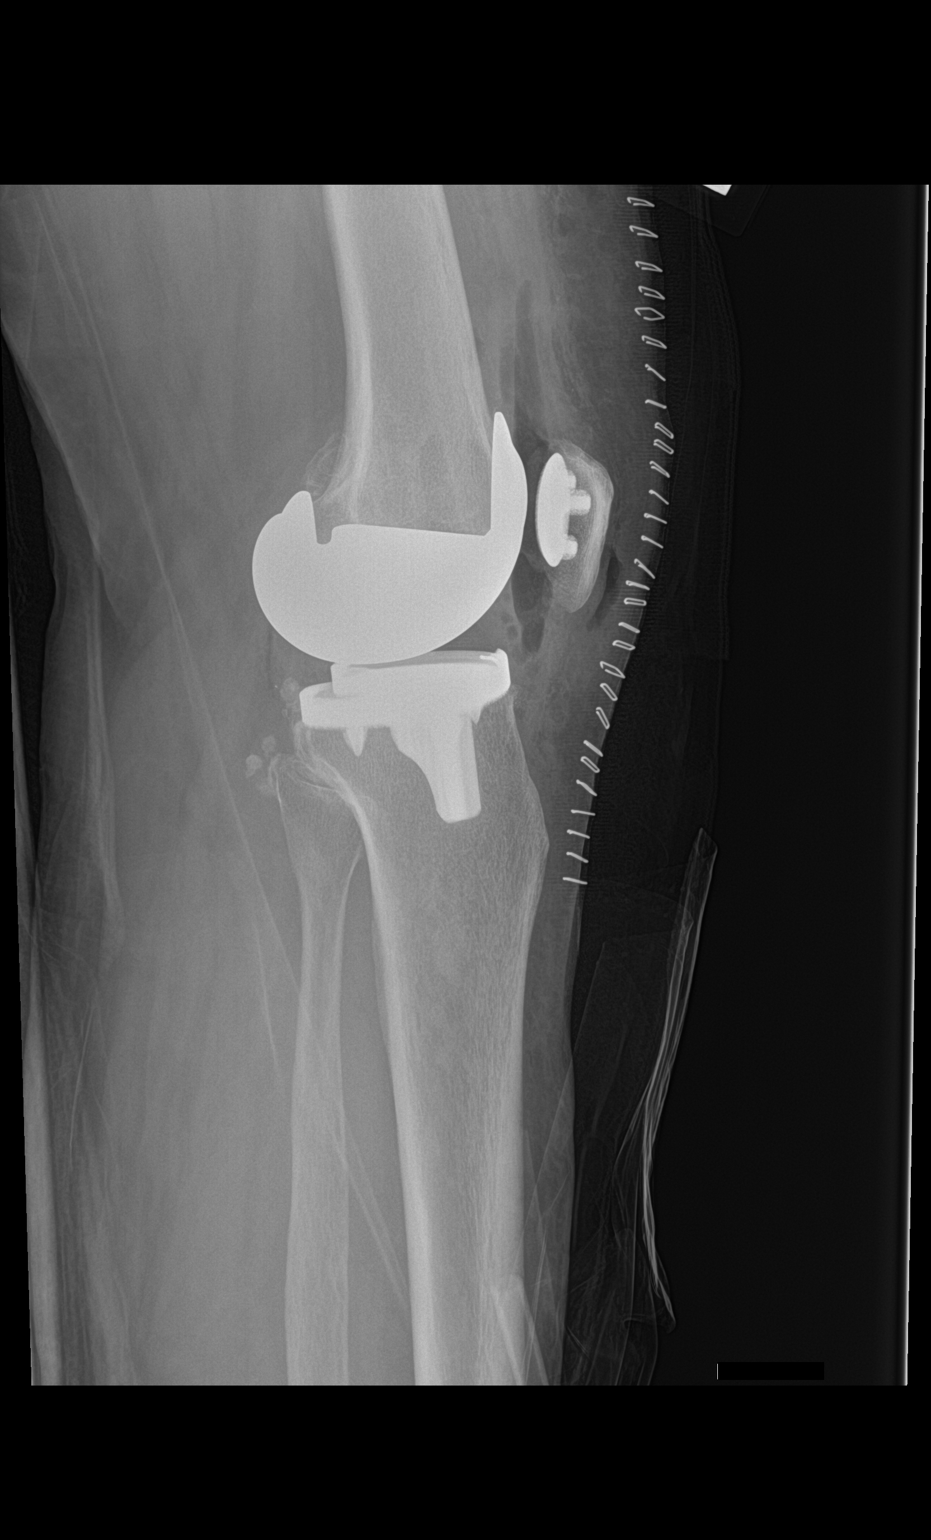

[knee lat (2 of 2)]
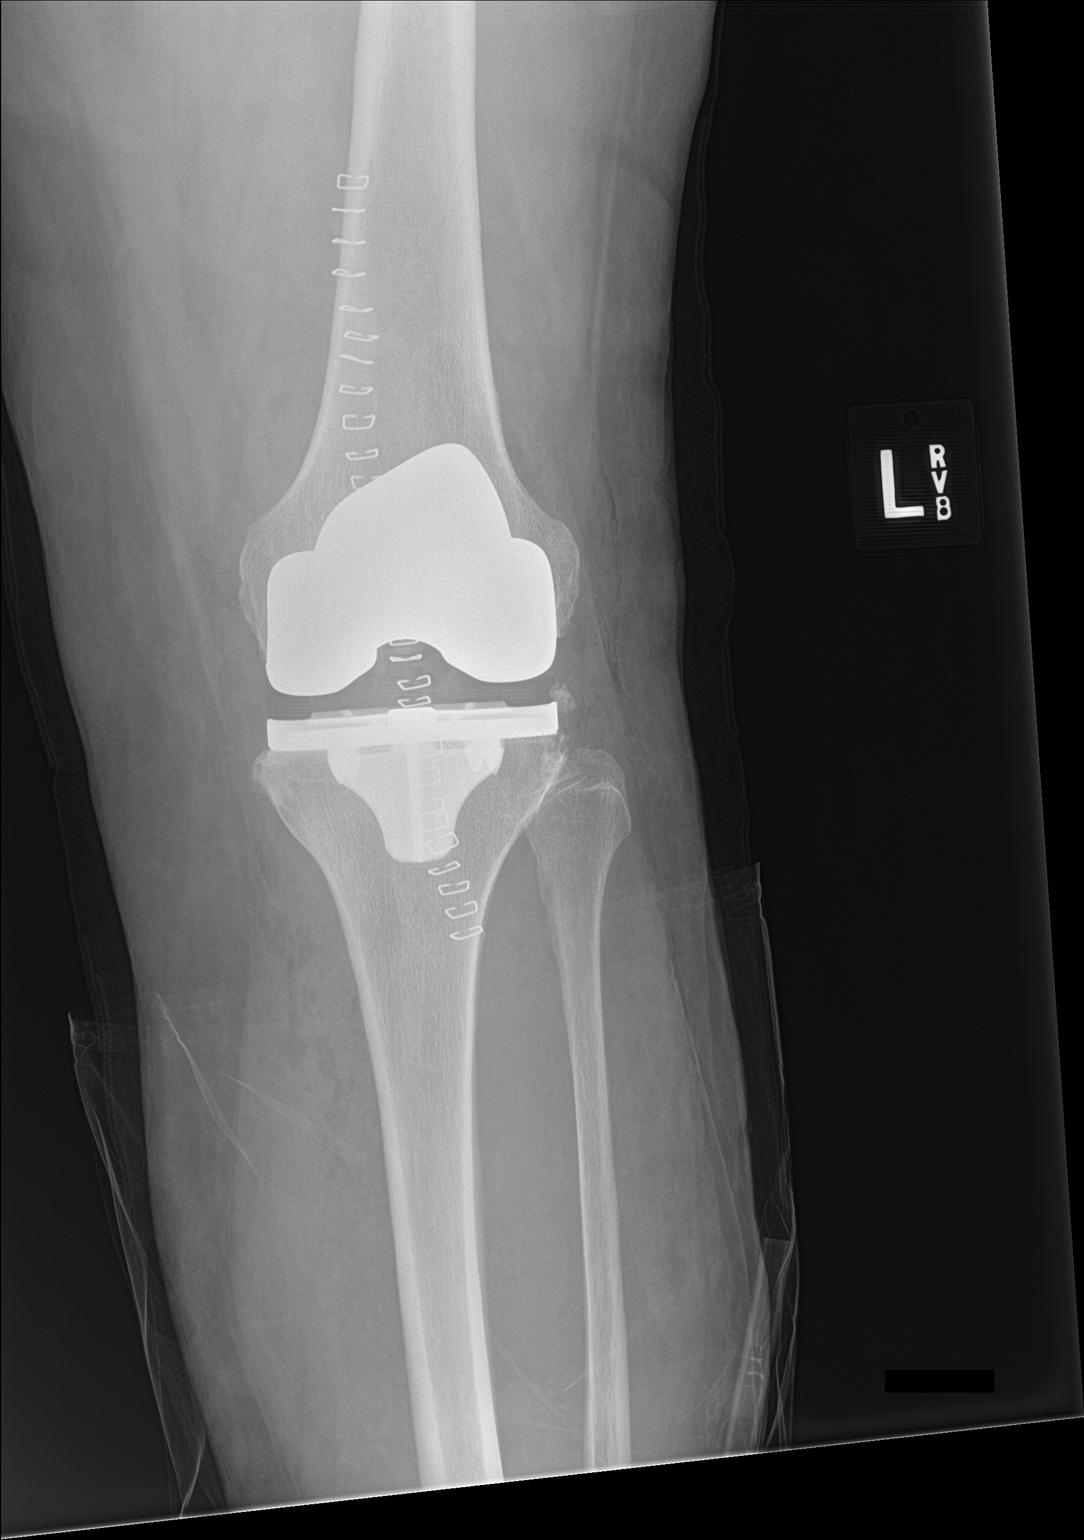

[2 of 2 positions shown; findings below may reference images not displayed]

FINDINGS: Interval left total knee prosthesis in satisfactory position and
alignment. No fracture, dislocation or effusion seen. Anterior skin
clips are noted. Posterior calcified synovial/loose bodies.
IMPRESSION: Satisfactory postoperative appearance of a left total knee
prosthesis.

## 2020-07-31 ENCOUNTER — Other Ambulatory Visit: Payer: Self-pay | Admitting: Family Medicine

## 2020-07-31 MED ORDER — PRAZIQUANTEL 600 MG PO TABS: 600.0000 mg | ORAL_TABLET | Freq: Every day | ORAL | 0 refills | Status: DC

## 2020-08-01 ENCOUNTER — Encounter (INDEPENDENT_AMBULATORY_CARE_PROVIDER_SITE_OTHER): Payer: Self-pay | Admitting: Family Medicine

## 2020-08-05 MED ORDER — PRAZIQUANTEL 600 MG PO TABS: 600.0000 mg | ORAL_TABLET | Freq: Every day | ORAL | 3 refills | Status: AC

## 2020-08-05 NOTE — Addendum Note (Signed)
Addended by: Lillia Carmel on: 08/05/2020 10:25 AM   Modules accepted: Orders

## 2020-09-04 ENCOUNTER — Encounter: Payer: Self-pay | Admitting: Internal Medicine

## 2020-10-07 ENCOUNTER — Ambulatory Visit (INDEPENDENT_AMBULATORY_CARE_PROVIDER_SITE_OTHER): Payer: 59

## 2020-10-07 ENCOUNTER — Ambulatory Visit (INDEPENDENT_AMBULATORY_CARE_PROVIDER_SITE_OTHER): Payer: 59 | Admitting: Orthopaedic Surgery

## 2020-10-07 DIAGNOSIS — M7062 Trochanteric bursitis, left hip: Secondary | ICD-10-CM | POA: Diagnosis not present

## 2020-10-07 DIAGNOSIS — M5432 Sciatica, left side: Secondary | ICD-10-CM

## 2020-10-07 MED ORDER — METHOCARBAMOL 500 MG PO TABS: 500.0000 mg | ORAL_TABLET | Freq: Four times a day (QID) | ORAL | 1 refills | Status: AC | PRN

## 2020-10-07 MED ORDER — METHYLPREDNISOLONE 4 MG PO TABS: ORAL_TABLET | ORAL | 0 refills | Status: AC

## 2020-10-07 NOTE — Progress Notes (Signed)
Office Visit Note   Patient: [name]           Date of Birth: [birth year]           MRN: [id number] Visit Date: 10/07/2020              Requested by: Lavada Mesi, MD [address] [place],  [place] [address] PCP: Lavada Mesi, MD   Assessment & Plan: Visit Diagnoses:  1. Sciatica, left side   2. Trochanteric bursitis, left hip     Plan: I would like to put her on a 6-day steroid taper to treat inflammation as well as try Robaxin as a muscle relaxant.  Also gave her prescription for outpatient physical therapy to work on both her low back and her left hip area.  All questions and concerns were answered and addressed.  We will see her back in 4 weeks after course of therapy.  Follow-Up Instructions: Return in about 4 weeks (around 11/04/2020).   Orders:  Orders Placed This Encounter  Procedures  . XR HIP UNILAT W OR W/O PELVIS 1V LEFT  . XR Lumbar Spine 2-3 Views   Meds ordered this encounter  Medications  . methylPREDNISolone (MEDROL) 4 MG tablet    Sig: Medrol dose pack. Take as instructed    Dispense:  21 tablet    Refill:  0  . methocarbamol (ROBAXIN) 500 MG tablet    Sig: Take 1 tablet (500 mg total) by mouth every 6 (six) hours as needed for muscle spasms.    Dispense:  60 tablet    Refill:  1      Procedures: No procedures performed   Clinical Data: No additional findings.   Subjective: Chief Complaint  Patient presents with  . Left Hip - Pain  . Lower Back - Pain  The patient is someone I have not seen in a while.  She comes in with left-sided low back pain and sciatica as well as left hip bursitis.  It has been slowly getting worse for her.  She is alternating ice and heat.  She denies any change in bowel or bladder function.  She denies any groin pain.  She tries to stay active.  She has had no acute change in her medical status.  HPI  Review of Systems She currently denies any headache, chest pain, shortness of breath, fever,  chills, nausea, vomiting  Objective: Vital Signs: There were no vitals taken for this visit.  Physical Exam She is alert and orient x3 and in no acute distress Ortho Exam Examination of her left hip shows it moves smoothly and fluidly.  There is pain to palpation of the trochanteric area.  She also has left-sided low back pain sciatica but a negative straight leg raise left side and excellent strength and normal sensation in her bilateral lower extremities. Specialty Comments:  No specialty comments available.  Imaging: XR HIP UNILAT W OR W/O PELVIS 1V LEFT  Result Date: 10/07/2020 An AP pelvis and lateral left hip shows normal-appearing left hip with no acute findings.  XR Lumbar Spine 2-3 Views  Result Date: 10/07/2020 2 views of lumbar spine show normal alignment with no acute findings    PMFS History: Patient Active Problem List   Diagnosis Date Noted  . Status post total left knee replacement 03/10/2019  . Unilateral primary osteoarthritis, left knee 02/09/2019  . Hypertension 08/15/2018  . GERD (gastroesophageal reflux disease) 08/15/2018  . Hyperlipidemia 08/15/2018  . History of unicondylar arthroplasty of  right knee 07/02/2017  . Unilateral primary osteoarthritis, right knee 05/19/2017  . Chronic pain of right knee 05/19/2017   Past Medical History:  Diagnosis Date  . Arthritis   . GERD (gastroesophageal reflux disease)   . HLD (hyperlipidemia)   . Hypertension    HAS NOT BEEN ON BP MEDS IN THE LAST 4 YEARS. REPORTS HER PCP IS AWARE   . Pneumonia    4 years ago     Family History  Adopted: Yes    Past Surgical History:  Procedure Laterality Date  . APPENDECTOMY     about 10 years ago   . CHOLECYSTECTOMY     4 years ago   . KNEE ARTHROSCOPY Right 02/2017  . PARTIAL KNEE ARTHROPLASTY Right 07/02/2017   Procedure: RIGHT UNICOMPARTMENTAL KNEE ARTHROPLASTY;  Surgeon: Kathryne Hitch, MD;  Location: [facility];  Service: Orthopedics;  Laterality: Right;   with block  . TOTAL KNEE ARTHROPLASTY Left 03/10/2019   Procedure: LEFT TOTAL KNEE ARTHROPLASTY;  Surgeon: Kathryne Hitch, MD;  Location: [facility];  Service: Orthopedics;  Laterality: Left;  . TUBAL LIGATION     38 years ago    Social History   Occupational History  . Not on file  Tobacco Use  . Smoking status: Never Smoker  . Smokeless tobacco: Never Used  Substance and Sexual Activity  . Alcohol use: Yes    Alcohol/week: 1.0 - 2.0 standard drink    Types: 1 - 2 Glasses of wine per week  . Drug use: No  . Sexual activity: Not on file

## 2020-11-04 ENCOUNTER — Ambulatory Visit: Payer: 59 | Admitting: Orthopaedic Surgery
